# Patient Record
Sex: Female | Born: 1975 | Race: Black or African American | Hispanic: No | Marital: Single | Smoking: Never smoker
Health system: Southern US, Community
[De-identification: ages and names within clinical notes are randomized; demographics above are authoritative.]

## PROBLEM LIST (undated history)

## (undated) DIAGNOSIS — R011 Cardiac murmur, unspecified: Secondary | ICD-10-CM

## (undated) DIAGNOSIS — I10 Essential (primary) hypertension: Secondary | ICD-10-CM

## (undated) HISTORY — PX: COLONOSCOPY: SHX174

## (undated) HISTORY — PX: DENTAL SURGERY: SHX609

---

## 1998-07-30 ENCOUNTER — Emergency Department (HOSPITAL_COMMUNITY): Admission: EM | Admit: 1998-07-30 | Discharge: 1998-07-31 | Payer: Self-pay | Admitting: Internal Medicine

## 2001-12-03 ENCOUNTER — Emergency Department (HOSPITAL_COMMUNITY): Admission: EM | Admit: 2001-12-03 | Discharge: 2001-12-03 | Payer: Self-pay | Admitting: Emergency Medicine

## 2001-12-05 ENCOUNTER — Emergency Department (HOSPITAL_COMMUNITY): Admission: EM | Admit: 2001-12-05 | Discharge: 2001-12-05 | Payer: Self-pay | Admitting: Emergency Medicine

## 2001-12-09 ENCOUNTER — Emergency Department (HOSPITAL_COMMUNITY): Admission: EM | Admit: 2001-12-09 | Discharge: 2001-12-09 | Payer: Self-pay

## 2003-02-05 ENCOUNTER — Emergency Department (HOSPITAL_COMMUNITY): Admission: EM | Admit: 2003-02-05 | Discharge: 2003-02-05 | Payer: Self-pay | Admitting: Emergency Medicine

## 2003-02-05 ENCOUNTER — Encounter: Payer: Self-pay | Admitting: Emergency Medicine

## 2003-02-07 ENCOUNTER — Emergency Department (HOSPITAL_COMMUNITY): Admission: EM | Admit: 2003-02-07 | Discharge: 2003-02-07 | Payer: Self-pay | Admitting: Emergency Medicine

## 2003-02-19 ENCOUNTER — Ambulatory Visit (HOSPITAL_COMMUNITY): Admission: RE | Admit: 2003-02-19 | Discharge: 2003-02-19 | Payer: Self-pay | Admitting: Internal Medicine

## 2003-02-26 ENCOUNTER — Ambulatory Visit (HOSPITAL_COMMUNITY): Admission: RE | Admit: 2003-02-26 | Discharge: 2003-02-26 | Payer: Self-pay | Admitting: Internal Medicine

## 2004-09-25 ENCOUNTER — Emergency Department (HOSPITAL_COMMUNITY): Admission: EM | Admit: 2004-09-25 | Discharge: 2004-09-25 | Payer: Self-pay | Admitting: Emergency Medicine

## 2005-07-27 ENCOUNTER — Emergency Department (HOSPITAL_COMMUNITY): Admission: EM | Admit: 2005-07-27 | Discharge: 2005-07-27 | Payer: Self-pay | Admitting: Family Medicine

## 2006-01-28 ENCOUNTER — Emergency Department (HOSPITAL_COMMUNITY): Admission: EM | Admit: 2006-01-28 | Discharge: 2006-01-28 | Payer: Self-pay | Admitting: Family Medicine

## 2007-01-22 ENCOUNTER — Emergency Department (HOSPITAL_COMMUNITY): Admission: EM | Admit: 2007-01-22 | Discharge: 2007-01-22 | Payer: Self-pay | Admitting: Emergency Medicine

## 2007-05-27 ENCOUNTER — Ambulatory Visit: Payer: Self-pay | Admitting: Vascular Surgery

## 2007-05-27 ENCOUNTER — Emergency Department (HOSPITAL_COMMUNITY): Admission: EM | Admit: 2007-05-27 | Discharge: 2007-05-27 | Payer: Self-pay | Admitting: Emergency Medicine

## 2007-05-28 ENCOUNTER — Emergency Department (HOSPITAL_COMMUNITY): Admission: EM | Admit: 2007-05-28 | Discharge: 2007-05-28 | Payer: Self-pay | Admitting: Emergency Medicine

## 2007-09-01 ENCOUNTER — Emergency Department (HOSPITAL_COMMUNITY): Admission: EM | Admit: 2007-09-01 | Discharge: 2007-09-01 | Payer: Self-pay | Admitting: Emergency Medicine

## 2007-09-04 ENCOUNTER — Emergency Department (HOSPITAL_COMMUNITY): Admission: EM | Admit: 2007-09-04 | Discharge: 2007-09-04 | Payer: Self-pay | Admitting: Emergency Medicine

## 2007-12-07 ENCOUNTER — Emergency Department (HOSPITAL_COMMUNITY): Admission: EM | Admit: 2007-12-07 | Discharge: 2007-12-07 | Payer: Self-pay | Admitting: Family Medicine

## 2007-12-12 ENCOUNTER — Emergency Department (HOSPITAL_COMMUNITY): Admission: EM | Admit: 2007-12-12 | Discharge: 2007-12-12 | Payer: Self-pay | Admitting: Family Medicine

## 2008-10-12 ENCOUNTER — Emergency Department (HOSPITAL_COMMUNITY): Admission: EM | Admit: 2008-10-12 | Discharge: 2008-10-12 | Payer: Self-pay | Admitting: Family Medicine

## 2008-10-31 ENCOUNTER — Emergency Department (HOSPITAL_COMMUNITY): Admission: EM | Admit: 2008-10-31 | Discharge: 2008-10-31 | Payer: Self-pay | Admitting: Family Medicine

## 2009-03-04 ENCOUNTER — Emergency Department (HOSPITAL_COMMUNITY): Admission: EM | Admit: 2009-03-04 | Discharge: 2009-03-04 | Payer: Self-pay | Admitting: Family Medicine

## 2009-03-16 ENCOUNTER — Emergency Department (HOSPITAL_COMMUNITY): Admission: EM | Admit: 2009-03-16 | Discharge: 2009-03-16 | Payer: Self-pay | Admitting: Family Medicine

## 2009-03-19 ENCOUNTER — Emergency Department (HOSPITAL_COMMUNITY): Admission: EM | Admit: 2009-03-19 | Discharge: 2009-03-19 | Payer: Self-pay | Admitting: Emergency Medicine

## 2009-04-30 ENCOUNTER — Emergency Department (HOSPITAL_COMMUNITY): Admission: EM | Admit: 2009-04-30 | Discharge: 2009-04-30 | Payer: Self-pay | Admitting: Family Medicine

## 2009-05-23 ENCOUNTER — Emergency Department (HOSPITAL_COMMUNITY): Admission: EM | Admit: 2009-05-23 | Discharge: 2009-05-23 | Payer: Self-pay | Admitting: Emergency Medicine

## 2009-08-05 ENCOUNTER — Emergency Department (HOSPITAL_COMMUNITY): Admission: EM | Admit: 2009-08-05 | Discharge: 2009-08-05 | Payer: Self-pay | Admitting: Emergency Medicine

## 2009-10-13 ENCOUNTER — Emergency Department (HOSPITAL_COMMUNITY): Admission: EM | Admit: 2009-10-13 | Discharge: 2009-10-13 | Payer: Self-pay | Admitting: Emergency Medicine

## 2009-12-30 ENCOUNTER — Emergency Department (HOSPITAL_COMMUNITY): Admission: EM | Admit: 2009-12-30 | Discharge: 2009-12-30 | Payer: Self-pay | Admitting: Emergency Medicine

## 2010-06-13 ENCOUNTER — Other Ambulatory Visit: Admission: RE | Admit: 2010-06-13 | Discharge: 2010-06-13 | Payer: Self-pay | Admitting: Family Medicine

## 2011-01-27 LAB — URINALYSIS, ROUTINE W REFLEX MICROSCOPIC
Bilirubin Urine: NEGATIVE
Hgb urine dipstick: NEGATIVE
Ketones, ur: 15 mg/dL — AB
Nitrite: NEGATIVE
pH: 5.5 (ref 5.0–8.0)

## 2011-01-27 LAB — POCT PREGNANCY, URINE: Preg Test, Ur: NEGATIVE

## 2011-01-27 LAB — URINE MICROSCOPIC-ADD ON

## 2011-01-30 LAB — CBC
Hemoglobin: 13.3 g/dL (ref 12.0–15.0)
MCHC: 33.3 g/dL (ref 30.0–36.0)
MCV: 82.4 fL (ref 78.0–100.0)
RBC: 4.83 MIL/uL (ref 3.87–5.11)
RDW: 14.7 % (ref 11.5–15.5)

## 2011-01-30 LAB — DIFFERENTIAL
Basophils Relative: 0 % (ref 0–1)
Eosinophils Absolute: 0 10*3/uL (ref 0.0–0.7)
Eosinophils Relative: 0 % (ref 0–5)
Monocytes Absolute: 0.4 10*3/uL (ref 0.1–1.0)
Monocytes Relative: 7 % (ref 3–12)

## 2011-01-30 LAB — URINE CULTURE

## 2011-01-30 LAB — POCT URINALYSIS DIP (DEVICE)
Bilirubin Urine: NEGATIVE
Glucose, UA: NEGATIVE mg/dL
Ketones, ur: NEGATIVE mg/dL
Protein, ur: NEGATIVE mg/dL

## 2011-01-30 LAB — RAPID URINE DRUG SCREEN, HOSP PERFORMED
Amphetamines: NOT DETECTED
Tetrahydrocannabinol: NOT DETECTED

## 2011-01-30 LAB — TSH: TSH: 2.282 u[IU]/mL (ref 0.350–4.500)

## 2011-01-30 LAB — BASIC METABOLIC PANEL
CO2: 24 mEq/L (ref 19–32)
Chloride: 104 mEq/L (ref 96–112)
GFR calc Af Amer: >60 mL/min (ref >60)
Glucose, Bld: 91 mg/dL (ref 70–99)
Sodium: 139 mEq/L (ref 135–145)

## 2011-01-30 LAB — POCT CARDIAC MARKERS: Troponin i, poc: <0.05 ng/mL (ref 0.00–0.09)

## 2011-01-30 LAB — T4, FREE: Free T4: 1 ng/dL (ref 0.80–1.80)

## 2011-01-30 LAB — POCT PREGNANCY, URINE: Preg Test, Ur: NEGATIVE

## 2011-02-05 LAB — POCT URINALYSIS DIP (DEVICE)
Glucose, UA: NEGATIVE mg/dL
Hgb urine dipstick: NEGATIVE
Specific Gravity, Urine: 1.02 (ref 1.005–1.030)
Urobilinogen, UA: 0.2 mg/dL (ref 0.0–1.0)

## 2011-07-13 LAB — INFLUENZA A AND B ANTIGEN (CONVERTED LAB)
Inflenza A Ag: NEGATIVE
Influenza B Ag: NEGATIVE

## 2011-07-31 LAB — POCT URINALYSIS DIP (DEVICE)
Operator id: 116391
Protein, ur: NEGATIVE
Urobilinogen, UA: 0.2

## 2011-07-31 LAB — POCT PREGNANCY, URINE: Operator id: 116391

## 2011-08-06 LAB — DIFFERENTIAL
Basophils Absolute: 0
Eosinophils Relative: 0
Lymphocytes Relative: 5 — ABNORMAL LOW
Monocytes Absolute: 0.3
Monocytes Relative: 3

## 2011-08-06 LAB — CBC
HCT: 36.6
Hemoglobin: 12.2
MCHC: 33.5
RDW: 15.3 — ABNORMAL HIGH

## 2011-08-06 LAB — BASIC METABOLIC PANEL
CO2: 25
GFR calc non Af Amer: >60
Glucose, Bld: 102 — ABNORMAL HIGH
Potassium: 3.8
Sodium: 140

## 2011-08-06 LAB — PREGNANCY, URINE: Preg Test, Ur: NEGATIVE

## 2011-08-06 LAB — URINALYSIS, ROUTINE W REFLEX MICROSCOPIC
Bilirubin Urine: NEGATIVE
Hgb urine dipstick: NEGATIVE
Ketones, ur: 15 — AB
Ketones, ur: 40 — AB
Nitrite: NEGATIVE
Nitrite: POSITIVE — AB
Urobilinogen, UA: 0.2
pH: 7.5

## 2011-08-29 ENCOUNTER — Emergency Department (INDEPENDENT_AMBULATORY_CARE_PROVIDER_SITE_OTHER)
Admission: EM | Admit: 2011-08-29 | Discharge: 2011-08-29 | Disposition: A | Discharge status: Home / Self Care | Payer: BC Managed Care – PPO | Source: Home / Self Care | Attending: Family Medicine | Admitting: Family Medicine

## 2011-08-29 ENCOUNTER — Encounter: Payer: Self-pay | Admitting: *Deleted

## 2011-08-29 DIAGNOSIS — Z0100 Encounter for examination of eyes and vision without abnormal findings: Secondary | ICD-10-CM

## 2011-08-29 HISTORY — DX: Cardiac murmur, unspecified: R01.1
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
## 2011-09-11 ENCOUNTER — Encounter (HOSPITAL_COMMUNITY): Payer: Self-pay | Admitting: Emergency Medicine

## 2011-09-11 ENCOUNTER — Emergency Department (HOSPITAL_COMMUNITY)
Admission: EM | Admit: 2011-09-11 | Discharge: 2011-09-11 | Disposition: A | Discharge status: Home / Self Care | Payer: BC Managed Care – PPO | Source: Home / Self Care | Attending: Emergency Medicine | Admitting: Emergency Medicine

## 2011-09-11 DIAGNOSIS — J329 Chronic sinusitis, unspecified: Secondary | ICD-10-CM

## 2011-09-11 MED ORDER — AMOXICILLIN 875 MG PO TABS: 875.0000 mg | ORAL_TABLET | Freq: Two times a day (BID) | ORAL | Status: AC

## 2011-09-11 NOTE — ED Notes (Signed)
C/o sinuis drainage, down throat.  Stuffy nose at night, c/o chest congestion. Minimal phlegm, slightly green.  Denies fever

## 2011-09-11 NOTE — ED Provider Notes (Signed)
History     CSN: [id number] Arrival date & time: 09/11/2011 11:32 AM   First MD Initiated Contact with Patient 09/11/11 1310      Chief Complaint  Patient presents with  . URI    (Consider location/radiation/quality/duration/timing/severity/associated sxs/prior treatment) Patient is a 35 y.o. female presenting with URI. The history is provided by the patient.  URI The primary symptoms include cough. Primary symptoms do not include fever, fatigue, headaches, ear pain, sore throat, wheezing, nausea or vomiting. Primary symptoms comment: green nasal congestion and post nasal drainage The current episode started 3 to 5 days ago. This is a new problem. The problem has not changed since onset. The cough began 3 to 5 days ago. The cough is new. The cough is non-productive (occasionally productive from the post nasal drainage).  Symptoms associated with the illness include congestion. The illness is not associated with chills, sinus pressure or rhinorrhea. The following treatments were addressed: Acetaminophen was effective. A decongestant was ineffective.    Past Medical History  Diagnosis Date  . Murmur, cardiac     Past Surgical History  Procedure Date  . Dental surgery     Family History  Problem Relation Age of Onset  . Hypertension Mother   . Hypertension Father     History  Substance Use Topics  . Smoking status: Never Smoker   . Smokeless tobacco: Not on file  . Alcohol Use: No    OB History    Grav Para Term Preterm Abortions TAB SAB Ect Mult Living                  Review of Systems  Constitutional: Negative for fever, chills and fatigue.  HENT: Positive for congestion and postnasal drip. Negative for ear pain, sore throat, rhinorrhea, sneezing and sinus pressure.   Respiratory: Positive for cough. Negative for shortness of breath and wheezing.   Cardiovascular: Negative for chest pain and palpitations.  Gastrointestinal: Negative for nausea and vomiting.    Neurological: Negative for headaches.    Allergies  Review of patient's allergies indicates no known allergies.  Home Medications   Current Outpatient Rx  Name Route Sig Dispense Refill  . ACETAMINOPHEN 500 MG PO TABS Oral Take 500 mg by mouth every 6 (six) hours as needed.      . AMOXICILLIN 875 MG PO TABS Oral Take 1 tablet (875 mg total) by mouth 2 (two) times daily. 20 tablet 0    BP 121/71  Pulse 71  Temp(Src) 98.4 F (36.9 C) (Oral)  Resp 16  SpO2 100%  LMP 09/11/2011  Physical Exam  Nursing note and vitals reviewed. Constitutional: She appears well-developed and well-nourished. No distress.  HENT:  Head: Normocephalic and atraumatic.  Right Ear: Tympanic membrane, external ear and ear canal normal.  Left Ear: Tympanic membrane, external ear and ear canal normal.  Nose: Mucosal edema (Rt > Lt) present. Right sinus exhibits no maxillary sinus tenderness and no frontal sinus tenderness. Left sinus exhibits no maxillary sinus tenderness and no frontal sinus tenderness.  Mouth/Throat: Uvula is midline, oropharynx is clear and moist and mucous membranes are normal. No oropharyngeal exudate, posterior oropharyngeal edema or posterior oropharyngeal erythema.  Neck: Neck supple.  Cardiovascular: Normal rate, regular rhythm and normal heart sounds.   Pulmonary/Chest: Effort normal and breath sounds normal. No respiratory distress.  Lymphadenopathy:    She has no cervical adenopathy.  Neurological: She is alert.  Skin: Skin is warm and dry.  Psychiatric: She has a normal  mood and affect.    ED Course  Procedures (including critical care time)  Labs Reviewed - No data to display No results found.   1. Sinusitis       MDM          Melody Comas, PA 09/11/11 1322

## 2011-09-11 NOTE — ED Provider Notes (Signed)
Medical screening examination/treatment/procedure(s) were performed by non-physician practitioner and as supervising physician I was immediately available for consultation/collaboration.  Raynald Blend, MD 09/11/11 1438

## 2012-12-01 ENCOUNTER — Other Ambulatory Visit (HOSPITAL_COMMUNITY)
Admission: RE | Admit: 2012-12-01 | Discharge: 2012-12-01 | Disposition: A | Discharge status: Home / Self Care | Payer: Commercial Indemnity | Source: Ambulatory Visit | Attending: Family Medicine | Admitting: Family Medicine

## 2012-12-01 ENCOUNTER — Other Ambulatory Visit: Payer: Self-pay | Admitting: Family Medicine

## 2012-12-01 DIAGNOSIS — Z113 Encounter for screening for infections with a predominantly sexual mode of transmission: Secondary | ICD-10-CM | POA: Insufficient documentation

## 2012-12-01 DIAGNOSIS — Z124 Encounter for screening for malignant neoplasm of cervix: Secondary | ICD-10-CM | POA: Insufficient documentation

## 2012-12-01 DIAGNOSIS — Z1151 Encounter for screening for human papillomavirus (HPV): Secondary | ICD-10-CM | POA: Insufficient documentation

## 2013-05-28 ENCOUNTER — Encounter (HOSPITAL_COMMUNITY): Payer: Self-pay | Admitting: Emergency Medicine

## 2013-05-28 ENCOUNTER — Emergency Department (HOSPITAL_COMMUNITY)
Admission: EM | Admit: 2013-05-28 | Discharge: 2013-05-28 | Disposition: A | Discharge status: Home / Self Care | Payer: Commercial Indemnity | Attending: Emergency Medicine | Admitting: Emergency Medicine

## 2013-05-28 DIAGNOSIS — R609 Edema, unspecified: Secondary | ICD-10-CM | POA: Insufficient documentation

## 2013-05-28 DIAGNOSIS — R011 Cardiac murmur, unspecified: Secondary | ICD-10-CM | POA: Insufficient documentation

## 2013-05-28 MED ORDER — HYDROCHLOROTHIAZIDE 25 MG PO TABS: 25.0000 mg | ORAL_TABLET | Freq: Every day | ORAL | Status: DC

## 2013-05-28 NOTE — ED Notes (Signed)
Pt comfortable with d/c and f/u instructions. Prescriptions x1 

## 2013-05-28 NOTE — ED Provider Notes (Signed)
CSN: [id number]     Arrival date & time 05/28/13  0413 History     First MD Initiated Contact with Patient 05/28/13 [phone]     Chief Complaint  Patient presents with  . Leg Swelling   (Consider location/radiation/quality/duration/timing/severity/associated sxs/prior Treatment) HPI This 37 year old female has chronic bilateral lower extremity edema left leg worse than the right chronically, she has had prior negative ultrasound to rule out DVT left leg in the past, she has compression stockings but has not been wearing them, she has not been elevating her legs at night but she is supposed to do either, tonight she realized she is more swelling to the top of her left foot than usual but has baseline swelling to her lower legs, she is no fever chest pain shortness breath lightheadedness type pain calf pain redness to her skin weakness or numbness or other concerns. She states her doctor was going to start some water pills as her edema tended to get worse and she would like to do that today. Past Medical History  Diagnosis Date  . Murmur, cardiac    Past Surgical History  Procedure Laterality Date  . Dental surgery     Family History  Problem Relation Age of Onset  . Hypertension Mother   . Hypertension Father    History  Substance Use Topics  . Smoking status: Never Smoker   . Smokeless tobacco: Not on file  . Alcohol Use: No   OB History   Grav Para Term Preterm Abortions TAB SAB Ect Mult Living                 Review of Systems 10 Systems reviewed and are negative for acute change except as noted in the HPI. Allergies  Review of patient's allergies indicates no known allergies.  Home Medications   Current Outpatient Rx  Name  Route  Sig  Dispense  Refill  . hydrochlorothiazide (HYDRODIURIL) 25 MG tablet   Oral   Take 1 tablet (25 mg total) by mouth daily.   3 tablet   0    BP 138/84  Pulse 107  Temp(Src) 99 F (37.2 C) (Oral)  Resp 18  SpO2 100%  LMP  05/25/2013 Physical Exam  Nursing note and vitals reviewed. Constitutional:  Awake, alert, nontoxic appearance.  HENT:  Head: Atraumatic.  Eyes: Right eye exhibits no discharge. Left eye exhibits no discharge.  Neck: Neck supple.  Cardiovascular: Normal rate and regular rhythm.   No murmur heard. Pulmonary/Chest: Effort normal and breath sounds normal. No respiratory distress. She has no wheezes. She has no rales. She exhibits no tenderness.  Abdominal: Soft. There is no tenderness. There is no rebound.  Musculoskeletal: She exhibits edema. She exhibits no tenderness.  Baseline ROM, no obvious new focal weakness. Both thighs and lower legs nontender with mild edema to both lower legs mild edema to the dorsum left foot normal light touch to the feet refill less than 2 seconds to the feet no erythema to the skin to suggest cellulitis  Neurological:  Mental status and motor strength appears baseline for patient and situation.  Skin: No rash noted.  Psychiatric: She has a normal mood and affect.    ED Course  Patient / Family / Caregiver understand and agree with ED impression and plan with expectations set for ED visit and follow-up. Procedures (including critical care time)  Labs Reviewed - No data to display No results found. 1. Peripheral edema     MDM  I  doubt any other EMC precluding discharge at this time including, but not necessarily limited to the following:DVT, SBI, ACS.  Hurman Horn, MD 05/28/13 2105

## 2013-05-28 NOTE — ED Notes (Signed)
PT. REPORTS PROGRESSING LEFT LOWER LEG / LEFT FOOT EDEMA ONSET LAST NIGHT , RESPIRATIONS UNLABORED /AMBULATORY. DENIES INJURY .

## 2013-06-07 ENCOUNTER — Encounter (HOSPITAL_COMMUNITY): Payer: Self-pay | Admitting: *Deleted

## 2013-06-07 ENCOUNTER — Emergency Department (HOSPITAL_COMMUNITY)
Admission: EM | Admit: 2013-06-07 | Discharge: 2013-06-07 | Disposition: A | Discharge status: Home / Self Care | Payer: Commercial Indemnity | Attending: Emergency Medicine | Admitting: Emergency Medicine

## 2013-06-07 DIAGNOSIS — R011 Cardiac murmur, unspecified: Secondary | ICD-10-CM | POA: Insufficient documentation

## 2013-06-07 DIAGNOSIS — Y9389 Activity, other specified: Secondary | ICD-10-CM | POA: Insufficient documentation

## 2013-06-07 DIAGNOSIS — IMO0002 Reserved for concepts with insufficient information to code with codable children: Secondary | ICD-10-CM | POA: Insufficient documentation

## 2013-06-07 DIAGNOSIS — Y92009 Unspecified place in unspecified non-institutional (private) residence as the place of occurrence of the external cause: Secondary | ICD-10-CM | POA: Insufficient documentation

## 2013-06-07 DIAGNOSIS — Z79899 Other long term (current) drug therapy: Secondary | ICD-10-CM | POA: Insufficient documentation

## 2013-06-07 DIAGNOSIS — X503XXA Overexertion from repetitive movements, initial encounter: Secondary | ICD-10-CM | POA: Insufficient documentation

## 2013-06-07 DIAGNOSIS — T148XXA Other injury of unspecified body region, initial encounter: Secondary | ICD-10-CM

## 2013-06-07 MED ORDER — IBUPROFEN 400 MG PO TABS: 400.0000 mg | ORAL_TABLET | Freq: Four times a day (QID) | ORAL | Status: DC | PRN

## 2013-06-07 NOTE — ED Provider Notes (Signed)
CSN: [id number]     Arrival date & time 06/07/13  1835 History     First MD Initiated Contact with Patient 06/07/13 2009     Chief Complaint  Patient presents with  . Leg Pain   HPI  History provided by the patient. Patient is a 37 year old female with history of obesity who presents with complaints of pain and soreness around her right thigh and knee. Patient first began noticing slight ache and irritation yesterday. She does report that she was doing some bending and lifting to rearrange furniture in a mattress at home but she denied any specific injury or trauma. She states that she was slightly concerned for the discomfort because it was unusual for her. It does not seem significantly worse with any movements or activity. She notices it most with outpatient. Denies any swelling or lump under the skin. Denies any erythema of the skin. Patient was also concerned because she recently had swelling to both of her feet and lower legs. She was seen in emergency room for this earlier this month. Since that time she states swelling has improved back to baseline. She denies any other aggravating or alleviating factors. No other associated symptoms. No fever, chills or sweats. No recent long travel. No immobility. No previous history of DVT or PE. She does not use any birth control or estrogen. She is a nonsmoker.    Past Medical History  Diagnosis Date  . Murmur, cardiac    Past Surgical History  Procedure Laterality Date  . Dental surgery     Family History  Problem Relation Age of Onset  . Hypertension Mother   . Hypertension Father    History  Substance Use Topics  . Smoking status: Never Smoker   . Smokeless tobacco: Not on file  . Alcohol Use: No   OB History   Grav Para Term Preterm Abortions TAB SAB Ect Mult Living                 Review of Systems  Constitutional: Negative for fever, chills and diaphoresis.  Respiratory: Negative for shortness of breath.   Cardiovascular:  Negative for chest pain, palpitations and leg swelling.  Neurological: Negative for weakness and numbness.  All other systems reviewed and are negative.    Allergies  Review of patient's allergies indicates no known allergies.  Home Medications   Current Outpatient Rx  Name  Route  Sig  Dispense  Refill  . acetaminophen (TYLENOL) 325 MG tablet   Oral   Take 650 mg by mouth every 6 (six) hours as needed for pain.         . hydrochlorothiazide (HYDRODIURIL) 25 MG tablet   Oral   Take 1 tablet (25 mg total) by mouth daily.   3 tablet   0    BP 138/84  Pulse 102  Temp(Src) 98.1 F (36.7 C) (Oral)  Resp 18  SpO2 98%  LMP 05/25/2013 Physical Exam  Nursing note and vitals reviewed. Constitutional: She is oriented to person, place, and time. She appears well-developed and well-nourished. No distress.  HENT:  Head: Normocephalic.  Cardiovascular: Normal rate and regular rhythm.   Pulmonary/Chest: Effort normal and breath sounds normal. No respiratory distress. She has no wheezes. She has no rales.  Abdominal: Soft.  Musculoskeletal: Normal range of motion. She exhibits no edema.       Legs: Patient with a very small area of mild tenderness over the medial distal vastus medialis muscle of the thigh.  Normal range of motion of the knee. Normal strength in all extremities. Normal distal pulses and sensations in the foot. There is no significant swelling of edema of bilateral feet and lower legs. Skin is normal without erythema.  Neurological: She is alert and oriented to person, place, and time.  Skin: Skin is warm and dry. No rash noted.  Psychiatric: She has a normal mood and affect. Her behavior is normal.    ED Course   Procedures    1. Muscle strain     MDM  8:30PM patient seen and evaluated. Patient well-appearing no acute distress. She denies any chest pain or shortness of breath. Her generalized swelling of both legs has improved since her previous visit.  Currently she denies any significant swelling. She has a very localized small area of mild tenderness around that anterior medial and proximal thigh just above the knee. No deformities or mass. Skin normal. Doubt any concerning cause the symptoms. Specifically I doubt any concerning DVT. No signs of cellulitis. No pain in the joint with normal range of motion.   Angus Seller, PA-C 06/07/13 2113

## 2013-06-07 NOTE — ED Notes (Signed)
Pt reports being seen here recently for peripheral edema to legs, now also having pain to right thigh, denies injury. Ambulatory at triage.

## 2013-06-07 NOTE — ED Notes (Signed)
Patient states that she is having pain above right knee.  Patient denies any injury to the area but she states she was moving heavy objects the other day.  Patient states that the area above knee is swollen and painful to the touch.

## 2013-06-08 NOTE — ED Provider Notes (Signed)
Medical screening examination/treatment/procedure(s) were performed by non-physician practitioner and as supervising physician I was immediately available for consultation/collaboration.   Audree Camel, MD 06/08/13 [phone]

## 2013-06-09 ENCOUNTER — Other Ambulatory Visit: Payer: Self-pay | Admitting: Family Medicine

## 2013-06-09 ENCOUNTER — Ambulatory Visit
Admission: RE | Admit: 2013-06-09 | Discharge: 2013-06-09 | Disposition: A | Discharge status: Home / Self Care | Payer: Managed Care, Other (non HMO) | Source: Ambulatory Visit | Attending: Family Medicine | Admitting: Family Medicine

## 2013-06-09 DIAGNOSIS — M7989 Other specified soft tissue disorders: Secondary | ICD-10-CM

## 2013-06-09 DIAGNOSIS — M79604 Pain in right leg: Secondary | ICD-10-CM

## 2013-12-09 ENCOUNTER — Other Ambulatory Visit: Payer: Self-pay | Admitting: Family Medicine

## 2013-12-09 DIAGNOSIS — Z1231 Encounter for screening mammogram for malignant neoplasm of breast: Secondary | ICD-10-CM

## 2013-12-24 ENCOUNTER — Ambulatory Visit
Admission: RE | Admit: 2013-12-24 | Discharge: 2013-12-24 | Disposition: A | Discharge status: Home / Self Care | Payer: Managed Care, Other (non HMO) | Source: Ambulatory Visit | Attending: Family Medicine | Admitting: Family Medicine

## 2013-12-24 DIAGNOSIS — Z1231 Encounter for screening mammogram for malignant neoplasm of breast: Secondary | ICD-10-CM

## 2013-12-25 ENCOUNTER — Other Ambulatory Visit: Payer: Self-pay | Admitting: Family Medicine

## 2013-12-25 DIAGNOSIS — R928 Other abnormal and inconclusive findings on diagnostic imaging of breast: Secondary | ICD-10-CM

## 2014-01-06 ENCOUNTER — Other Ambulatory Visit: Payer: Managed Care, Other (non HMO)

## 2014-01-14 ENCOUNTER — Ambulatory Visit
Admission: RE | Admit: 2014-01-14 | Discharge: 2014-01-14 | Disposition: A | Discharge status: Home / Self Care | Payer: Managed Care, Other (non HMO) | Source: Ambulatory Visit | Attending: Family Medicine | Admitting: Family Medicine

## 2014-01-14 DIAGNOSIS — R928 Other abnormal and inconclusive findings on diagnostic imaging of breast: Secondary | ICD-10-CM

## 2014-06-22 ENCOUNTER — Encounter (HOSPITAL_COMMUNITY): Payer: Self-pay | Admitting: Emergency Medicine

## 2014-06-22 ENCOUNTER — Emergency Department (HOSPITAL_COMMUNITY): Payer: Managed Care, Other (non HMO)

## 2014-06-22 ENCOUNTER — Emergency Department (HOSPITAL_COMMUNITY)
Admission: EM | Admit: 2014-06-22 | Discharge: 2014-06-22 | Disposition: A | Discharge status: Home / Self Care | Payer: Managed Care, Other (non HMO) | Attending: Emergency Medicine | Admitting: Emergency Medicine

## 2014-06-22 DIAGNOSIS — Z79899 Other long term (current) drug therapy: Secondary | ICD-10-CM | POA: Diagnosis not present

## 2014-06-22 DIAGNOSIS — IMO0002 Reserved for concepts with insufficient information to code with codable children: Secondary | ICD-10-CM | POA: Diagnosis not present

## 2014-06-22 DIAGNOSIS — S298XXA Other specified injuries of thorax, initial encounter: Secondary | ICD-10-CM | POA: Insufficient documentation

## 2014-06-22 DIAGNOSIS — Y92410 Unspecified street and highway as the place of occurrence of the external cause: Secondary | ICD-10-CM | POA: Diagnosis not present

## 2014-06-22 DIAGNOSIS — M791 Myalgia, unspecified site: Secondary | ICD-10-CM

## 2014-06-22 DIAGNOSIS — Y9389 Activity, other specified: Secondary | ICD-10-CM | POA: Diagnosis not present

## 2014-06-22 DIAGNOSIS — R011 Cardiac murmur, unspecified: Secondary | ICD-10-CM | POA: Diagnosis not present

## 2014-06-22 MED ORDER — NAPROXEN 500 MG PO TABS: 500.0000 mg | ORAL_TABLET | Freq: Two times a day (BID) | ORAL | Status: DC | PRN
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
## 2014-06-22 NOTE — ED Notes (Signed)
Pt currently in xray
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
## 2014-06-23 NOTE — ED Provider Notes (Signed)
Medical screening examination/treatment/procedure(s) were performed by non-physician practitioner and as supervising physician I was immediately available for consultation/collaboration.   EKG Interpretation None        Debby Freiberg, MD 06/23/14 [phone]

## 2014-07-14 ENCOUNTER — Other Ambulatory Visit: Payer: Self-pay | Admitting: Family Medicine

## 2014-07-14 DIAGNOSIS — N6489 Other specified disorders of breast: Secondary | ICD-10-CM

## 2014-07-14 DIAGNOSIS — N6320 Unspecified lump in the left breast, unspecified quadrant: Secondary | ICD-10-CM

## 2014-07-20 ENCOUNTER — Ambulatory Visit
Admission: RE | Admit: 2014-07-20 | Discharge: 2014-07-20 | Disposition: A | Discharge status: Home / Self Care | Payer: Managed Care, Other (non HMO) | Source: Ambulatory Visit | Attending: Family Medicine | Admitting: Family Medicine

## 2014-07-20 DIAGNOSIS — N6320 Unspecified lump in the left breast, unspecified quadrant: Secondary | ICD-10-CM

## 2014-07-20 DIAGNOSIS — N6489 Other specified disorders of breast: Secondary | ICD-10-CM

## 2015-08-02 IMAGING — MG MM DIGITAL SCREENING BILAT W/ CAD
4 series · 4 of 4 positions shown · non-contrast
Comparison: None

CLINICAL DATA: Screening.

EXAM:
DIGITAL SCREENING BILATERAL MAMMOGRAM WITH CAD

[R CC]
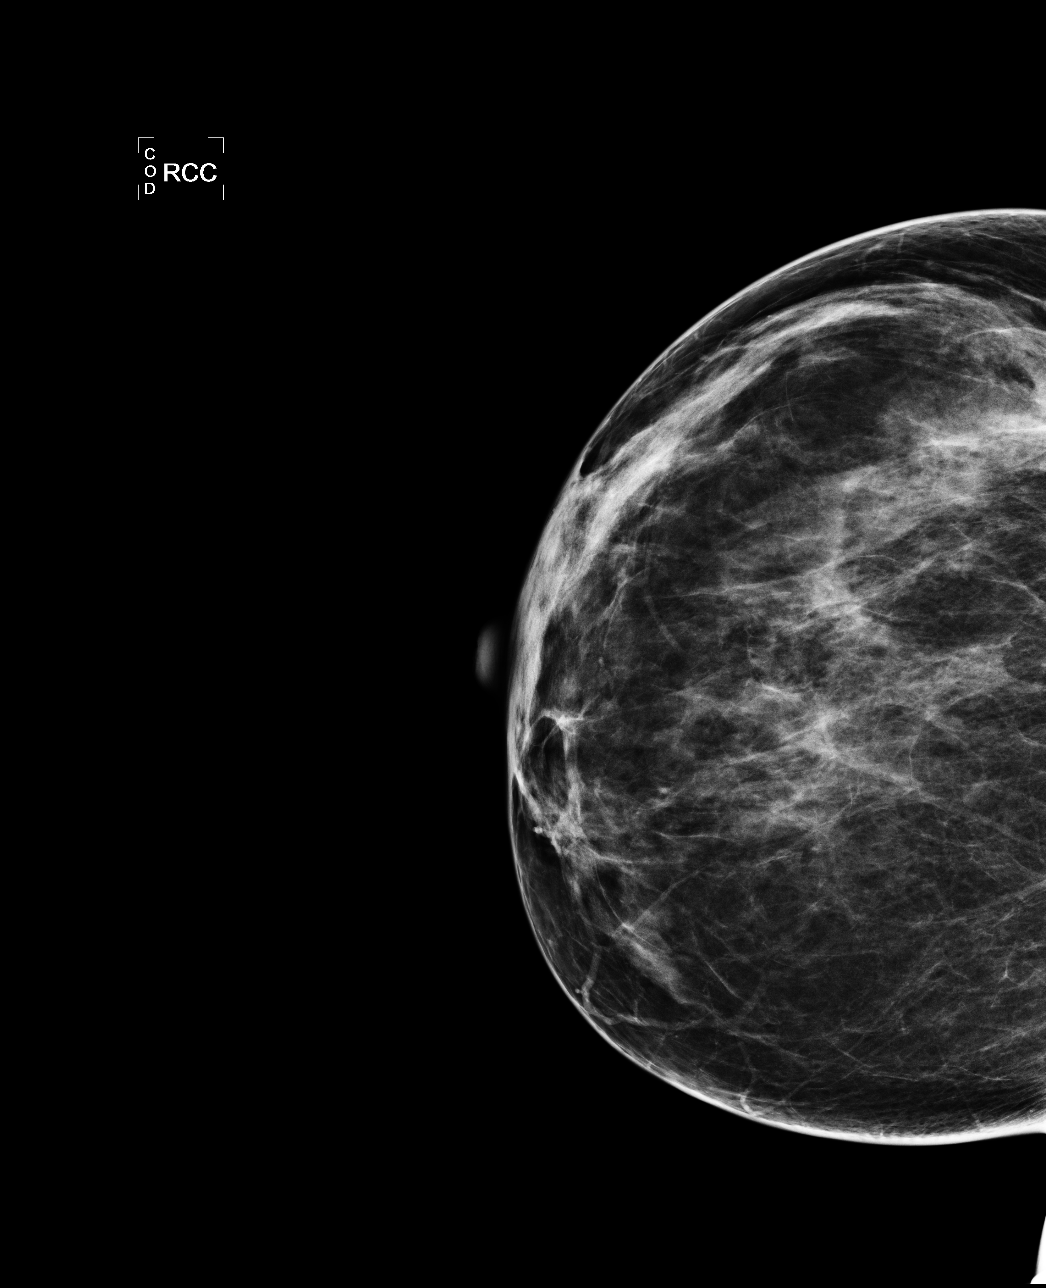

[L CC]
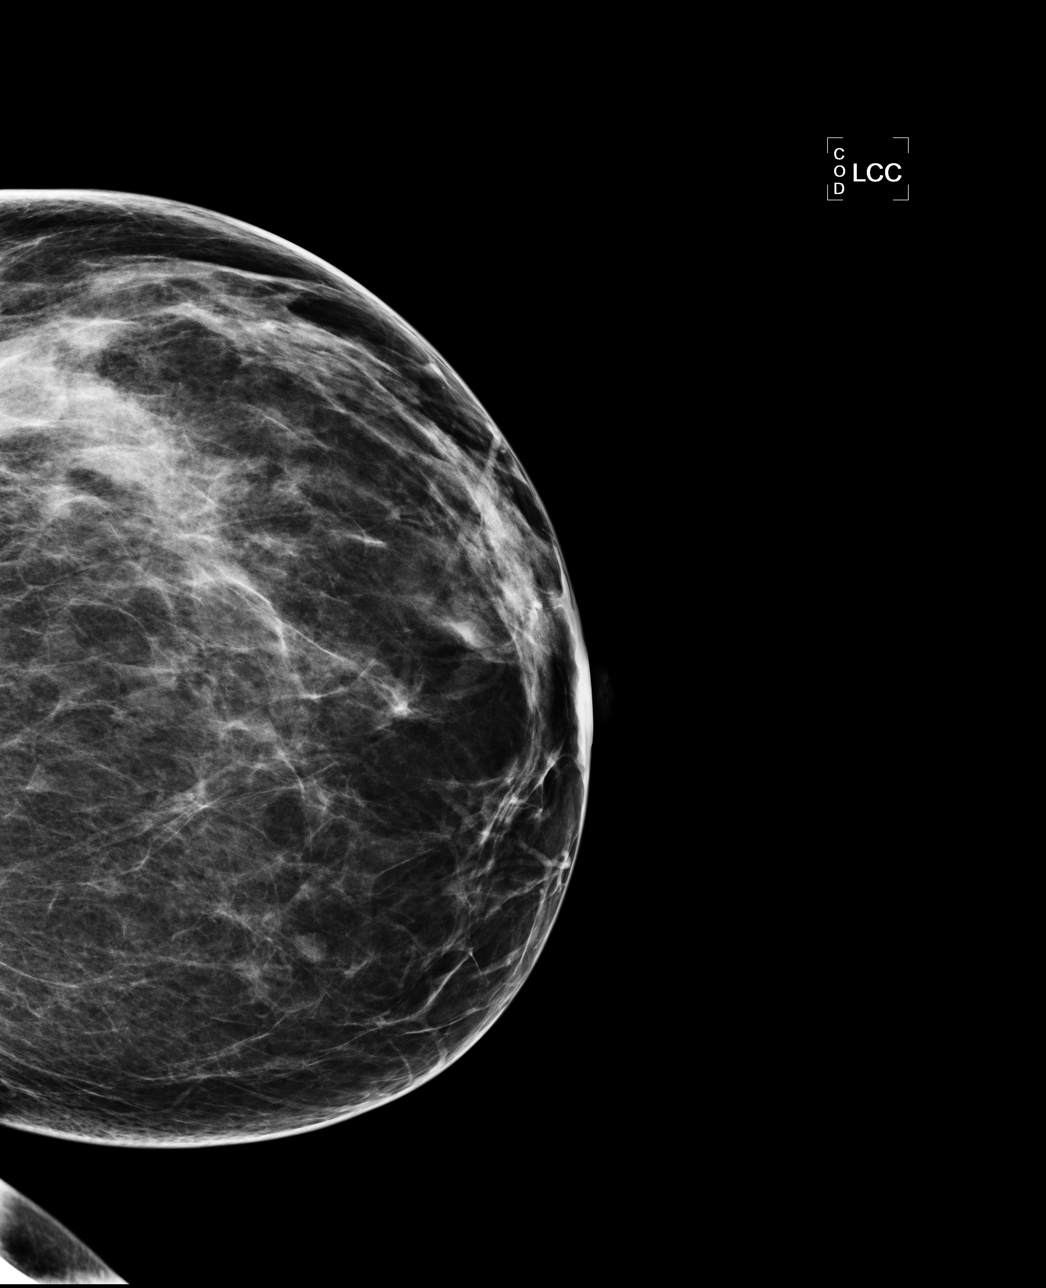

[L MLO]
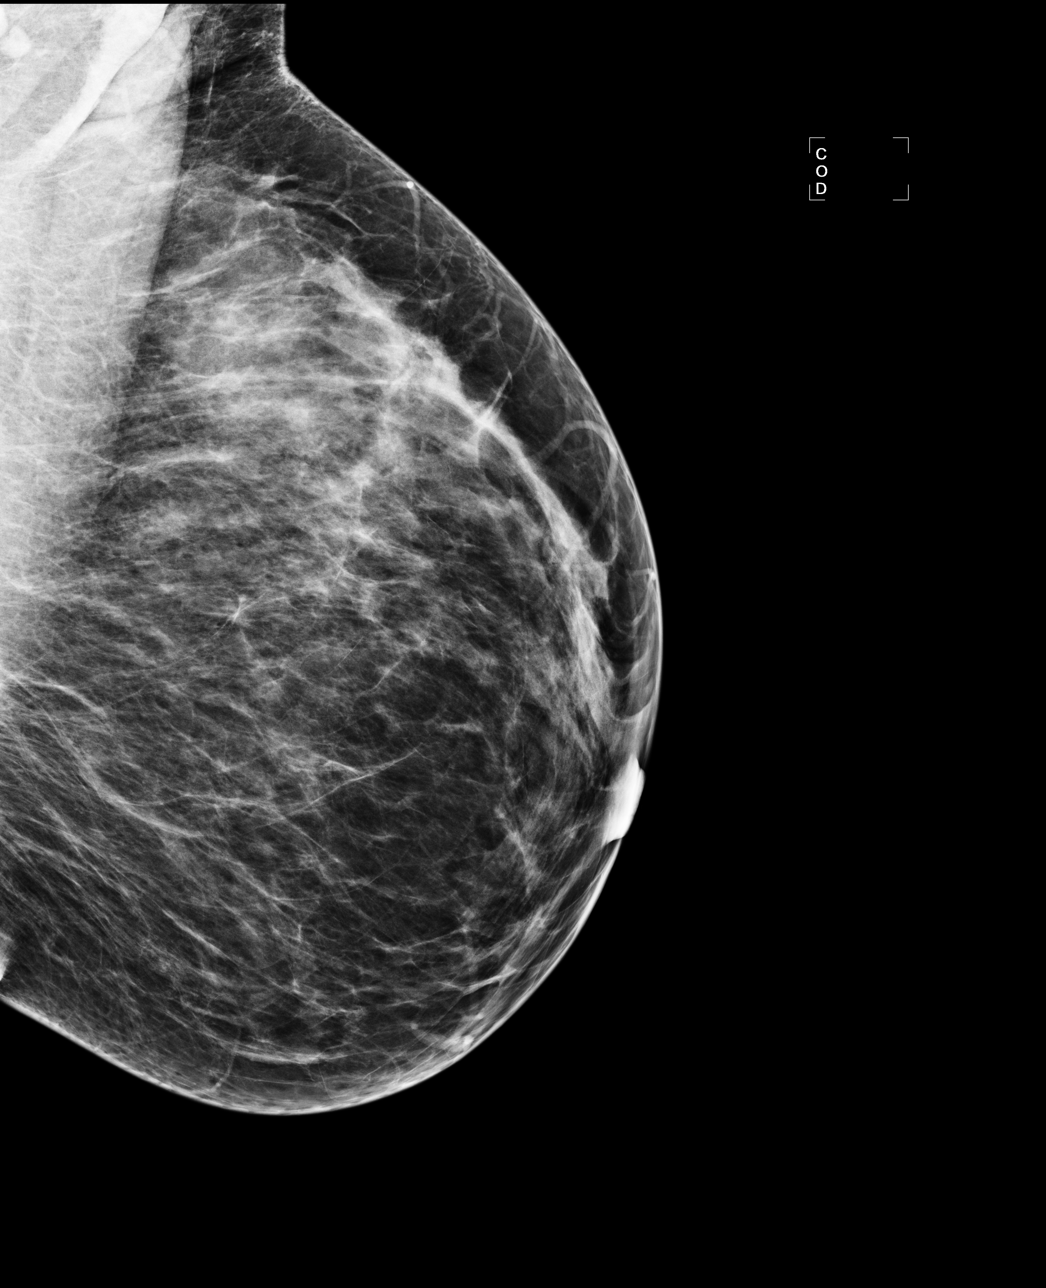

[R MLO]
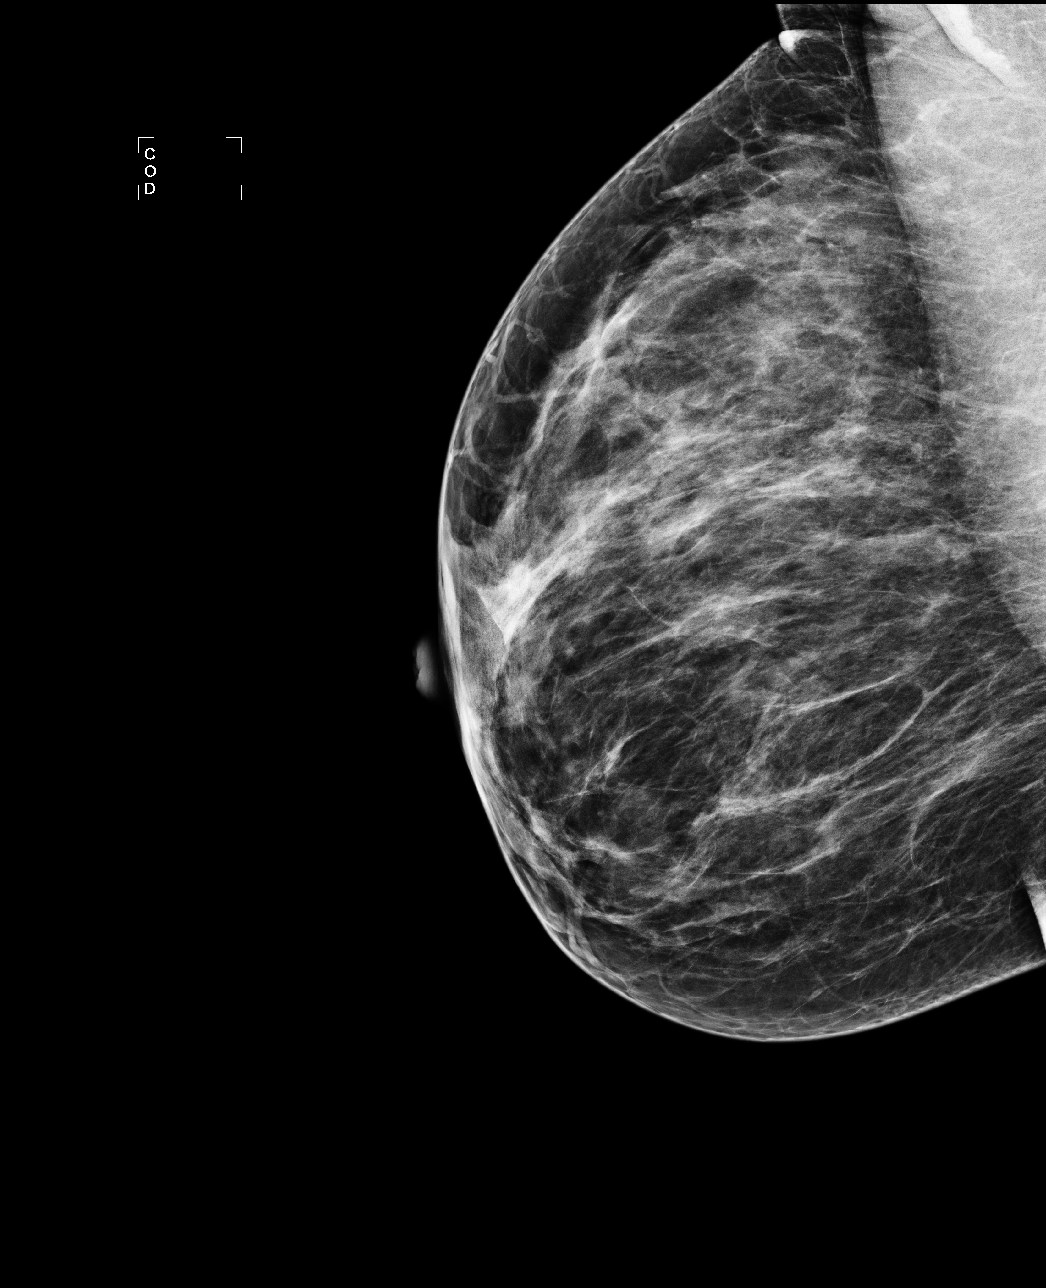

[4 of 4 positions shown; findings below may reference images not displayed]

ACR Breast Density Category c: The breast tissue is heterogeneously
dense, which may obscure small masses.
FINDINGS: In the left breast, a possible asymmetry warrants further evaluation
with spot compression views and possibly ultrasound. In the right
breast, no findings suspicious for malignancy. Images were processed
with CAD.
IMPRESSION: Further evaluation is suggested for possible asymmetry in the left
breast.

RECOMMENDATION:
Diagnostic mammogram and possibly ultrasound of the left breast.
(Code:[id number]-22D)

The patient will be contacted regarding the findings, and additional
imaging will be scheduled.

BI-RADS CATEGORY  0: Incomplete. Need additional imaging evaluation
and/or prior mammograms for comparison.

## 2016-07-19 ENCOUNTER — Other Ambulatory Visit (HOSPITAL_COMMUNITY)
Admission: RE | Admit: 2016-07-19 | Discharge: 2016-07-19 | Disposition: A | Discharge status: Home / Self Care | Payer: 59 | Source: Ambulatory Visit | Attending: Family Medicine | Admitting: Family Medicine

## 2016-07-19 ENCOUNTER — Other Ambulatory Visit: Payer: Self-pay | Admitting: Family Medicine

## 2016-07-19 DIAGNOSIS — Z124 Encounter for screening for malignant neoplasm of cervix: Secondary | ICD-10-CM | POA: Insufficient documentation

## 2016-07-19 DIAGNOSIS — Z1151 Encounter for screening for human papillomavirus (HPV): Secondary | ICD-10-CM | POA: Insufficient documentation

## 2016-07-20 LAB — CYTOLOGY - PAP

## 2016-09-08 ENCOUNTER — Encounter (HOSPITAL_COMMUNITY): Payer: Self-pay | Admitting: Emergency Medicine

## 2016-09-08 ENCOUNTER — Emergency Department (HOSPITAL_COMMUNITY)
Admission: EM | Admit: 2016-09-08 | Discharge: 2016-09-08 | Disposition: A | Discharge status: Home / Self Care | Payer: 59 | Attending: Emergency Medicine | Admitting: Emergency Medicine

## 2016-09-08 DIAGNOSIS — K0889 Other specified disorders of teeth and supporting structures: Secondary | ICD-10-CM | POA: Diagnosis present

## 2016-09-08 DIAGNOSIS — K029 Dental caries, unspecified: Secondary | ICD-10-CM | POA: Diagnosis not present

## 2016-09-08 MED ORDER — HYDROCODONE-ACETAMINOPHEN 5-325 MG PO TABS: 1.0000 | ORAL_TABLET | ORAL | 0 refills | Status: DC | PRN

## 2016-09-08 NOTE — Discharge Instructions (Signed)
Take your medication as prescribed as needed for pain relief. I also recommend continuing to take your prescription of ibuprofen as prescribed for pain relief. I recommend refraining from eating foods or drinking liquids that are extremely hot or cold which may worsen your pain. I recommend calling your dentist office on Monday to schedule a follow-up appointment for reevaluation. Please return to the Emergency Department if symptoms worsen or new onset of fever, headache, facial/neck swelling, drainage, difficulty swallowing resulting in drooling, difficulty breathing, vomiting, unable to keep fluids down.

## 2016-09-08 NOTE — ED Notes (Signed)
Declined W/C at D/C and was escorted to lobby by RN. 

## 2016-09-08 NOTE — ED Triage Notes (Signed)
Filling done on Monday to a tooth in left lower mouth; still in throbbing pain. Tried to call dentist line and no answer. Tried the 800mg  Ibuprofen given by the dentist without any relief.

## 2016-09-08 NOTE — ED Provider Notes (Signed)
[facility] DEPT Provider Note   By signing my name below, I, Bea Graff, attest that this documentation has been prepared under the direction and in the presence of Harlene Ramus, PA-C. Electronically Signed: Bea Graff, ED Scribe. 09/08/16. 2:16 PM.   History   Chief Complaint Chief Complaint  Patient presents with  . Dental Pain   The history is provided by the patient and medical records. No language interpreter was used.    HPI Comments:  Meagan Padilla is a 40 y.o. female who presents to the Emergency Department complaining of left lower dental pain that began about 5 days ago after having the tooth filled at the dentist's office, Dr. Carmelia Roller. Pt said she contacted the office two days ago and was prescribed Ibuprofen 800 mg that she reports taking with minimal relief of the pain. She reports taking an Oxycodone that made her feel "loopy" so she did not want to take anymore. There are no modifying factors noted. She denies facial swelling, neck swelling, drainage from the tooth, fever, chills, nausea, vomiting, difficulty swallowing or breathing.    Past Medical History:  Diagnosis Date  . Murmur, cardiac     There are no active problems to display for this patient.   Past Surgical History:  Procedure Laterality Date  . DENTAL SURGERY      OB History    No data available       Home Medications    Prior to Admission medications   Medication Sig Start Date End Date Taking? Authorizing Provider  acetaminophen (TYLENOL) 325 MG tablet Take 650 mg by mouth every 6 (six) hours as needed for pain.    Historical Provider, MD  hydrochlorothiazide (HYDRODIURIL) 25 MG tablet Take 1 tablet (25 mg total) by mouth daily. 05/28/13   Riki Altes, MD  HYDROcodone-acetaminophen (NORCO/VICODIN) 5-325 MG tablet Take 1 tablet by mouth every 4 (four) hours as needed. 09/08/16   Nona Dell, PA-C  ibuprofen (ADVIL,MOTRIN) 400 MG tablet Take 1 tablet (400 mg  total) by mouth every 6 (six) hours as needed for pain. 06/07/13   Hazel Sams, PA-C  naproxen (NAPROSYN) 500 MG tablet Take 1 tablet (500 mg total) by mouth 2 (two) times daily as needed for mild pain, moderate pain or headache (TAKE WITH MEALS.). 06/22/14   Mercedes Camprubi-Soms, PA-C    Family History Family History  Problem Relation Age of Onset  . Hypertension Mother   . Hypertension Father     Social History Social History  Substance Use Topics  . Smoking status: Never Smoker  . Smokeless tobacco: Never Used  . Alcohol use No     Allergies   Patient has no known allergies.   Review of Systems Review of Systems  Constitutional: Negative for chills and fever.  HENT: Positive for dental problem. Negative for facial swelling and trouble swallowing.   Gastrointestinal: Negative for nausea and vomiting.     Physical Exam Updated Vital Signs BP 162/97 (BP Location: Left Arm)   Pulse 102   Temp 97.9 F (36.6 C) (Oral)   Resp 20   LMP 08/25/2016 (Approximate)   SpO2 99%   Physical Exam  Constitutional: She is oriented to person, place, and time. She appears well-developed and well-nourished.  HENT:  Head: Normocephalic and atraumatic.  Mouth/Throat: Uvula is midline, oropharynx is clear and moist and mucous membranes are normal. No trismus in the jaw. Abnormal dentition. Dental caries present. No uvula swelling. No oropharyngeal exudate.    Poor  dentition throughout with multiple extracted teeth. No swelling, induration, fluctuance or drainage of the gingiva surrounding tooth # 22. Floor of mouth soft. Pt tolerating secretions. No facial or neck swelling.  Eyes: Conjunctivae and EOM are normal. Right eye exhibits no discharge. Left eye exhibits no discharge. No scleral icterus.  Neck: Normal range of motion. Neck supple.  Cardiovascular: Normal rate and intact distal pulses.   HR 84 on exam  Pulmonary/Chest: Effort normal. No stridor.  Lymphadenopathy:    She has  no cervical adenopathy.  Neurological: She is alert and oriented to person, place, and time.  Skin: Skin is warm and dry.  Nursing note and vitals reviewed.    ED Treatments / Results  DIAGNOSTIC STUDIES: Oxygen Saturation is 99% on RA, normal by my interpretation.   COORDINATION OF CARE: 2:13 PM- Will prescribe pain medication and encouraged pt to continue taking Ibuprofen. Instructed her to follow up with her dentist on Monday. Pt verbalizes understanding and agrees to plan.  Medications - No data to display  Labs (all labs ordered are listed, but only abnormal results are displayed) Labs Reviewed - No data to display  EKG  EKG Interpretation None       Radiology No results found.  Procedures Procedures (including critical care time)  Medications Ordered in ED Medications - No data to display   Initial Impression / Assessment and Plan / ED Course  I have reviewed the triage vital signs and the nursing notes.  Pertinent labs & imaging results that were available during my care of the patient were reviewed by me and considered in my medical decision making (see chart for details).  Clinical Course     Patient with toothache.  No gross abscess.  Exam unconcerning for Ludwig's angina or spread of infection.  Will treat with pain medicine.  Urged patient to follow-up with dentist.     I personally performed the services described in this documentation, which was scribed in my presence. The recorded information has been reviewed and is accurate.   Final Clinical Impressions(s) / ED Diagnoses   Final diagnoses:  Pain, dental    New Prescriptions New Prescriptions   HYDROCODONE-ACETAMINOPHEN (NORCO/VICODIN) 5-325 MG TABLET    Take 1 tablet by mouth every 4 (four) hours as needed.     Chesley Noon Hydro, [place] 09/08/16 1429    Pattricia Boss, MD 09/17/16 [phone]

## 2017-04-30 ENCOUNTER — Emergency Department (HOSPITAL_COMMUNITY)
Admission: EM | Admit: 2017-04-30 | Discharge: 2017-04-30 | Disposition: A | Discharge status: Home / Self Care | Payer: 59 | Attending: Emergency Medicine | Admitting: Emergency Medicine

## 2017-04-30 ENCOUNTER — Encounter (HOSPITAL_COMMUNITY): Payer: Self-pay | Admitting: Vascular Surgery

## 2017-04-30 DIAGNOSIS — R51 Headache: Secondary | ICD-10-CM | POA: Insufficient documentation

## 2017-04-30 DIAGNOSIS — Z79899 Other long term (current) drug therapy: Secondary | ICD-10-CM | POA: Insufficient documentation

## 2017-04-30 DIAGNOSIS — I10 Essential (primary) hypertension: Secondary | ICD-10-CM | POA: Insufficient documentation

## 2017-04-30 DIAGNOSIS — Y939 Activity, unspecified: Secondary | ICD-10-CM | POA: Diagnosis not present

## 2017-04-30 DIAGNOSIS — Y999 Unspecified external cause status: Secondary | ICD-10-CM | POA: Diagnosis not present

## 2017-04-30 DIAGNOSIS — Y92481 Parking lot as the place of occurrence of the external cause: Secondary | ICD-10-CM | POA: Insufficient documentation

## 2017-04-30 HISTORY — DX: Essential (primary) hypertension: I10

## 2017-04-30 MED ORDER — CYCLOBENZAPRINE HCL 10 MG PO TABS: 10.0000 mg | ORAL_TABLET | Freq: Three times a day (TID) | ORAL | 0 refills | Status: DC | PRN
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
## 2017-04-30 NOTE — Discharge Instructions (Signed)
Alternate 600 mg of ibuprofen and [id number] mg of Tylenol every 3 hours for the next 3 days. Do not exceed 4000 mg of Tylenol daily. Flexeril for muscle relaxation (preferably only at night) but do not drive or operate machinery with flexeril use. Ice to areas of soreness for the next few days and then may move to heat. Expect to be sore for the next few day and follow up with primary care physician for recheck of ongoing symptoms but return to ER for emergent changing or worsening of symptoms.   Use Moist heat therapy by taking a dish rag and soaking in water then ringing out excess water then microwave for 10-15 seconds until hot but not so hot that it would burn and heat to areas of soreness for 15 minutes, multiple times a day.
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
## 2017-04-30 NOTE — ED Notes (Signed)
Pt is in stable condition upon d/c and ambulates from ED. 

## 2017-10-17 ENCOUNTER — Other Ambulatory Visit: Payer: Self-pay | Admitting: Family Medicine

## 2017-10-17 DIAGNOSIS — Z1231 Encounter for screening mammogram for malignant neoplasm of breast: Secondary | ICD-10-CM

## 2017-11-08 ENCOUNTER — Ambulatory Visit
Admission: RE | Admit: 2017-11-08 | Discharge: 2017-11-08 | Disposition: A | Discharge status: Home / Self Care | Payer: 59 | Source: Ambulatory Visit | Attending: Family Medicine | Admitting: Family Medicine

## 2017-11-08 DIAGNOSIS — Z1231 Encounter for screening mammogram for malignant neoplasm of breast: Secondary | ICD-10-CM

## 2018-01-31 ENCOUNTER — Emergency Department (HOSPITAL_COMMUNITY)
Admission: EM | Admit: 2018-01-31 | Discharge: 2018-01-31 | Disposition: A | Discharge status: Home / Self Care | Payer: 59 | Attending: Emergency Medicine | Admitting: Emergency Medicine

## 2018-01-31 ENCOUNTER — Encounter (HOSPITAL_COMMUNITY): Payer: Self-pay | Admitting: Emergency Medicine

## 2018-01-31 ENCOUNTER — Other Ambulatory Visit: Payer: Self-pay

## 2018-01-31 DIAGNOSIS — R51 Headache: Secondary | ICD-10-CM | POA: Insufficient documentation

## 2018-01-31 DIAGNOSIS — I10 Essential (primary) hypertension: Secondary | ICD-10-CM | POA: Diagnosis present

## 2018-01-31 NOTE — ED Triage Notes (Addendum)
Pt from home with c/o htn. Pt bp at time of assessment is 165/99. Pt states she is under stress from trying to find a job. Pt states she takes her medication some days because she is worried her bp will be low. Pt reports HA but denies changes in vision. Neuro assessment unremarkable

## 2018-01-31 NOTE — ED Provider Notes (Signed)
[facility] DEPT Provider Note   CSN: [id number] Arrival date & time: 01/31/18  0437     History   Chief Complaint Chief Complaint  Patient presents with  . Hypertension    HPI Meagan Padilla is a 42 y.o. female.  HPI  42 year old female presents with hypertension.  She states she knows her blood pressure started going up last night because she developed a flushed and warm feeling and a headache.  She rates the headache is a 1-2.  The headache is improved today.  She did not have any vomiting, dizziness, blurry vision, chest pain, or weakness/numbness.  No trouble urinating.  She checked her blood pressure yesterday and is 143/85 and then she checked it again and was around 160/90.  She is supposed to be taking atenolol-chlorthalidone 50/25 mg.  However she states she takes it very rarely.  She is somewhat concerned that her blood pressure will go lower.  She checks her blood pressure about 2 or 3 times a day.  Usually they average for her is around 130/86.  She estimates she rarely takes her medicine.  Past Medical History:  Diagnosis Date  . Hypertension   . Murmur, cardiac     There are no active problems to display for this patient.   Past Surgical History:  Procedure Laterality Date  . DENTAL SURGERY       OB History   None      Home Medications    Prior to Admission medications   Medication Sig Start Date End Date Taking? Authorizing Provider  acetaminophen (TYLENOL) 325 MG tablet Take 650 mg by mouth every 6 (six) hours as needed for pain.    [provider]  cyclobenzaprine (FLEXERIL) 10 MG tablet Take 1 tablet (10 mg total) by mouth 3 (three) times daily as needed for muscle spasms. 04/30/17   Fawze, Mina A, PA-C  hydrochlorothiazide (HYDRODIURIL) 25 MG tablet Take 1 tablet (25 mg total) by mouth daily. 05/28/13   Riki Altes, MD  HYDROcodone-acetaminophen (NORCO/VICODIN) 5-325 MG tablet Take 1 tablet by mouth every 4  (four) hours as needed. 09/08/16   Nona Dell, PA-C  ibuprofen (ADVIL,MOTRIN) 400 MG tablet Take 1 tablet (400 mg total) by mouth every 6 (six) hours as needed for pain. 06/07/13   Hazel Sams, PA-C  naproxen (NAPROSYN) 500 MG tablet Take 1 tablet (500 mg total) by mouth 2 (two) times daily as needed for mild pain, moderate pain or headache (TAKE WITH MEALS.). 06/22/14   Street, [place], PA-C    Family History Family History  Problem Relation Age of Onset  . Hypertension Mother   . Hypertension Father     Social History Social History   Tobacco Use  . Smoking status: Never Smoker  . Smokeless tobacco: Never Used  Substance Use Topics  . Alcohol use: No  . Drug use: No     Allergies   Patient has no known allergies.   Review of Systems Review of Systems  Eyes: Negative for visual disturbance.  Respiratory: Negative for shortness of breath.   Cardiovascular: Negative for chest pain.  Gastrointestinal: Negative for vomiting.  Neurological: Positive for headaches. Negative for dizziness, weakness and numbness.  All other systems reviewed and are negative.    Physical Exam Updated Vital Signs BP (!) 165/99 (BP Location: Left Arm)   Pulse 72   Temp 98 F (36.7 C) (Oral)   Resp 16   LMP 01/22/2018   SpO2 100%  Physical Exam  Constitutional: She is oriented to person, place, and time. She appears well-developed and well-nourished. No distress.  HENT:  Head: Normocephalic and atraumatic.  Right Ear: External ear normal.  Left Ear: External ear normal.  Nose: Nose normal.  Eyes: Pupils are equal, round, and reactive to light. EOM are normal. Right eye exhibits no discharge. Left eye exhibits no discharge.  Neck: Neck supple.  Cardiovascular: Normal rate, regular rhythm and normal heart sounds.  Pulmonary/Chest: Effort normal and breath sounds normal.  Abdominal: She exhibits no distension.  Neurological: She is alert and oriented to person, place, and  time.  CN 3-12 grossly intact. 5/5 strength in all 4 extremities. Grossly normal sensation. Normal finger to nose.   Skin: Skin is warm and dry. She is not diaphoretic.  Nursing note and vitals reviewed.    ED Treatments / Results  Labs (all labs ordered are listed, but only abnormal results are displayed) Labs Reviewed - No data to display  EKG None  Radiology No results found.  Procedures Procedures (including critical care time)  Medications Ordered in ED Medications - No data to display   Initial Impression / Assessment and Plan / ED Course  I have reviewed the triage vital signs and the nursing notes.  Pertinent labs & imaging results that were available during my care of the patient were reviewed by me and considered in my medical decision making (see chart for details).     Patient has a mild headache but otherwise is well-appearing and in no distress.  She has no signs or symptoms of hypertensive emergency.  She has a long-standing history of high blood pressure.  I discussed the importance of taking her medicine daily as it does not work in prn doses as well.  Discussed if she is particularly concerned about low blood pressure which she has never noticed at home, she can try and cut her medicine in half.  I have encouraged her to keep a log of her blood pressures while on the medicine to take to her doctor.  Otherwise she appears stable for discharge home and we discussed return precautions.  Final Clinical Impressions(s) / ED Diagnoses   Final diagnoses:  Essential hypertension    ED Discharge Orders    None       Sherwood Gambler, MD 01/31/18 [phone]

## 2018-02-13 ENCOUNTER — Encounter (HOSPITAL_COMMUNITY): Payer: Self-pay | Admitting: Emergency Medicine

## 2018-02-13 ENCOUNTER — Other Ambulatory Visit: Payer: Self-pay

## 2018-02-13 ENCOUNTER — Emergency Department (HOSPITAL_COMMUNITY)
Admission: EM | Admit: 2018-02-13 | Discharge: 2018-02-14 | Disposition: A | Discharge status: Home / Self Care | Payer: 59 | Attending: Emergency Medicine | Admitting: Emergency Medicine

## 2018-02-13 ENCOUNTER — Emergency Department (HOSPITAL_COMMUNITY): Payer: 59

## 2018-02-13 DIAGNOSIS — Z5321 Procedure and treatment not carried out due to patient leaving prior to being seen by health care provider: Secondary | ICD-10-CM | POA: Insufficient documentation

## 2018-02-13 DIAGNOSIS — R51 Headache: Secondary | ICD-10-CM | POA: Insufficient documentation

## 2018-02-13 NOTE — ED Triage Notes (Signed)
Pt reports HA/HTN X a few weeks, pt has been seen for same. States pressure like feeling, also reports congestion. No neuro S/S, denies CP/SOB

## 2018-02-14 NOTE — ED Notes (Signed)
Pt to NF stating she is leaving

## 2018-04-10 ENCOUNTER — Encounter (HOSPITAL_COMMUNITY): Payer: Self-pay | Admitting: Emergency Medicine

## 2018-04-10 ENCOUNTER — Emergency Department (HOSPITAL_COMMUNITY)
Admission: EM | Admit: 2018-04-10 | Discharge: 2018-04-10 | Disposition: A | Discharge status: Home / Self Care | Payer: Self-pay | Attending: Emergency Medicine | Admitting: Emergency Medicine

## 2018-04-10 ENCOUNTER — Emergency Department (HOSPITAL_COMMUNITY): Payer: Self-pay

## 2018-04-10 DIAGNOSIS — J069 Acute upper respiratory infection, unspecified: Secondary | ICD-10-CM | POA: Insufficient documentation

## 2018-04-10 DIAGNOSIS — I10 Essential (primary) hypertension: Secondary | ICD-10-CM | POA: Insufficient documentation

## 2018-04-10 NOTE — ED Notes (Signed)
Bed: WTR7 Expected date:  Expected time:  Means of arrival:  Comments: EMS-nose bleed-triage

## 2018-04-10 NOTE — ED Triage Notes (Signed)
Patient here from home with complaints of cough, fever x1 week.

## 2018-04-10 NOTE — Discharge Instructions (Addendum)
Your vital signs are within normal limits today.  Your oxygen level is 99% on room air.  Your chest x-ray is negative for pneumonia, collapsed lung, or other emergent situations.  Your examination favors an upper respiratory infection.  Please wash hands frequently.  Please increase fluids.  You may continue over-the-counter medication that you are currently taking.  Please do not add any additional over-the-counter cold medications, unless they are approved for patients with high blood pressure.  Please see your primary physician in the office systems possible for follow-up and recheck.  Return to the emergency department if any emergent changes in your condition, problems, or concerns.

## 2018-04-10 NOTE — ED Provider Notes (Signed)
[facility] DEPT Provider Note   CSN: [id number] Arrival date & time: 04/10/18  1502     History   Chief Complaint Chief Complaint  Patient presents with  . Cough  . Fever    HPI Meagan Padilla is a 42 y.o. female.  The history is provided by the patient.  URI   This is a new problem. The current episode started more than 1 week ago. The problem has not changed since onset.The maximum temperature recorded prior to her arrival was 101 to 101.9 F. Associated symptoms include congestion and cough. Pertinent negatives include no chest pain, no abdominal pain, no diarrhea, no vomiting, no dysuria, no sinus pain, no neck pain and no wheezing. Treatments tried: Thera-Flu, tylenol.    Past Medical History:  Diagnosis Date  . Hypertension   . Murmur, cardiac     There are no active problems to display for this patient.   Past Surgical History:  Procedure Laterality Date  . DENTAL SURGERY       OB History   None      Home Medications    Prior to Admission medications   Medication Sig Start Date End Date Taking? Authorizing Provider  acetaminophen (TYLENOL) 500 MG tablet Take 500 mg by mouth every 6 (six) hours as needed for headache.     [provider]  atenolol-chlorthalidone (TENORETIC) 50-25 MG tablet Take 1 tablet by mouth daily.    [provider]  hydrochlorothiazide (HYDRODIURIL) 25 MG tablet Take 1 tablet (25 mg total) by mouth daily. Patient not taking: Reported on 01/31/2018 05/28/13   Riki Altes, MD  naproxen (NAPROSYN) 500 MG tablet Take 1 tablet (500 mg total) by mouth 2 (two) times daily as needed for mild pain, moderate pain or headache (TAKE WITH MEALS.). Patient not taking: Reported on 01/31/2018 06/22/14   Street, [place], PA-C    Family History Family History  Problem Relation Age of Onset  . Hypertension Mother   . Hypertension Father     Social History Social History   Tobacco Use  . Smoking  status: Never Smoker  . Smokeless tobacco: Never Used  Substance Use Topics  . Alcohol use: No  . Drug use: No     Allergies   Patient has no known allergies.   Review of Systems Review of Systems  Constitutional: Positive for fever. Negative for activity change.       All ROS Neg except as noted in HPI  HENT: Positive for congestion, postnasal drip and sinus pressure. Negative for nosebleeds and sinus pain.   Eyes: Negative for photophobia and discharge.  Respiratory: Positive for cough. Negative for shortness of breath and wheezing.   Cardiovascular: Negative for chest pain and palpitations.  Gastrointestinal: Negative for abdominal pain, blood in stool, diarrhea and vomiting.  Genitourinary: Negative for dysuria, frequency and hematuria.  Musculoskeletal: Negative for arthralgias, back pain and neck pain.  Skin: Negative.   Neurological: Negative for dizziness, seizures and speech difficulty.  Psychiatric/Behavioral: Negative for confusion and hallucinations.     Physical Exam Updated Vital Signs BP (!) 142/89 (BP Location: Right Arm)   Pulse 79   Temp 98.1 F (36.7 C) (Oral)   Resp 18   LMP 03/20/2018 (Approximate)   SpO2 99%   Physical Exam  Constitutional: She is oriented to person, place, and time. She appears well-developed and well-nourished.  Non-toxic appearance.  HENT:  Head: Normocephalic.  Right Ear: Tympanic membrane and external ear normal.  Left Ear: Tympanic membrane and external ear normal.  Nasal congestion present. Mild increased redness of the posterior pharynx.  Uvula is midline.  Airway is patent.  Eyes: Pupils are equal, round, and reactive to light. EOM and lids are normal.  Neck: Normal range of motion. Neck supple. Carotid bruit is not present.  Cardiovascular: Normal rate, regular rhythm, normal heart sounds, intact distal pulses and normal pulses.  Pulmonary/Chest: Breath sounds normal. No respiratory distress.  Patient speaks in  complete sentences without problem.  Abdominal: Soft. Bowel sounds are normal. There is no tenderness. There is no guarding.  Musculoskeletal: Normal range of motion.  Lymphadenopathy:       Head (right side): No submandibular adenopathy present.       Head (left side): No submandibular adenopathy present.    She has no cervical adenopathy.  Neurological: She is alert and oriented to person, place, and time. She has normal strength. No cranial nerve deficit or sensory deficit.  Skin: Skin is warm and dry.  Psychiatric: She has a normal mood and affect. Her speech is normal.  Nursing note and vitals reviewed.    ED Treatments / Results  Labs (all labs ordered are listed, but only abnormal results are displayed) Labs Reviewed - No data to display  EKG None  Radiology Dg Chest 2 View  Result Date: 04/10/2018 CLINICAL DATA:  Cough and fever for 1 week, history hypertension, heart murmur EXAM: CHEST - 2 VIEW COMPARISON:  06/22/2014 FINDINGS: Borderline enlargement of cardiac silhouette. Mediastinal contours and pulmonary vascularity normal. Lungs clear. No infiltrate, pleural effusion, or pneumothorax. Bones unremarkable. IMPRESSION: No acute abnormalities. Electronically Signed   By: Lavonia Dana M.D.   On: 04/10/2018 15:50    Procedures Procedures (including critical care time)  Medications Ordered in ED Medications - No data to display   Initial Impression / Assessment and Plan / ED Course  I have reviewed the triage vital signs and the nursing notes.  Pertinent labs & imaging results that were available during my care of the patient were reviewed by me and considered in my medical decision making (see chart for details).       Final Clinical Impressions(s) / ED Diagnoses MDM  Vital signs are within normal limits.  Pulse oximetry is 99% on room air.  Within normal limits by my interpretation.  The patient speaks in complete sentences without problem.  Patient has had some  temperature elevation, but no temperature elevation on today's visit.  Chest x-ray is negative for acute problems.  Doubt pneumonia, pleural effusion, or pneumothorax.  I suspect from the examination findings that the patient has an upper respiratory infection.  I have asked the patient to continue her TheraFlu, but not to add any additional over-the-counter medications unless they are hypertensive patient friendly.  We discussed the importance of good handwashing, and we discussed the importance of good hydration.  Patient acknowledges understanding and is in agreement with this plan.   Final diagnoses:  Upper respiratory tract infection, unspecified type    ED Discharge Orders    None       Lily Kocher, PA-C 04/10/18 1632    Charlesetta Shanks, MD 04/10/18 [phone]

## 2018-08-15 ENCOUNTER — Ambulatory Visit (HOSPITAL_COMMUNITY)
Admission: EM | Admit: 2018-08-15 | Discharge: 2018-08-15 | Disposition: A | Discharge status: Home / Self Care | Payer: Self-pay | Attending: Family Medicine | Admitting: Family Medicine

## 2018-08-15 ENCOUNTER — Emergency Department (HOSPITAL_COMMUNITY): Payer: Self-pay

## 2018-08-15 ENCOUNTER — Emergency Department (HOSPITAL_COMMUNITY)
Admission: EM | Admit: 2018-08-15 | Discharge: 2018-08-15 | Disposition: A | Discharge status: Home / Self Care | Payer: Self-pay | Attending: Emergency Medicine | Admitting: Emergency Medicine

## 2018-08-15 ENCOUNTER — Encounter (HOSPITAL_COMMUNITY): Payer: Self-pay

## 2018-08-15 ENCOUNTER — Encounter (HOSPITAL_COMMUNITY): Payer: Self-pay | Admitting: Emergency Medicine

## 2018-08-15 DIAGNOSIS — K529 Noninfective gastroenteritis and colitis, unspecified: Secondary | ICD-10-CM | POA: Insufficient documentation

## 2018-08-15 DIAGNOSIS — K5900 Constipation, unspecified: Secondary | ICD-10-CM

## 2018-08-15 DIAGNOSIS — Z79899 Other long term (current) drug therapy: Secondary | ICD-10-CM | POA: Insufficient documentation

## 2018-08-15 DIAGNOSIS — I10 Essential (primary) hypertension: Secondary | ICD-10-CM | POA: Insufficient documentation

## 2018-08-15 DIAGNOSIS — R1084 Generalized abdominal pain: Secondary | ICD-10-CM

## 2018-08-15 LAB — CBC
HCT: 41.9 % (ref 36.0–46.0)
HEMOGLOBIN: 12.8 g/dL (ref 12.0–15.0)
MCH: 25.1 pg — ABNORMAL LOW (ref 26.0–34.0)
MCHC: 30.5 g/dL (ref 30.0–36.0)
MCV: 82.2 fL (ref 80.0–100.0)
Platelets: 277 10*3/uL (ref 150–400)
RBC: 5.1 MIL/uL (ref 3.87–5.11)
RDW: 15.9 % — ABNORMAL HIGH (ref 11.5–15.5)
WBC: 10.3 10*3/uL (ref 4.0–10.5)
nRBC: 0 % (ref 0.0–0.2)

## 2018-08-15 LAB — URINALYSIS, ROUTINE W REFLEX MICROSCOPIC
BACTERIA UA: NONE SEEN
BILIRUBIN URINE: NEGATIVE
GLUCOSE, UA: NEGATIVE mg/dL
HGB URINE DIPSTICK: NEGATIVE
KETONES UR: 5 mg/dL — AB
NITRITE: NEGATIVE
PROTEIN: NEGATIVE mg/dL
Specific Gravity, Urine: 1.019 (ref 1.005–1.030)
pH: 8 (ref 5.0–8.0)

## 2018-08-15 LAB — COMPREHENSIVE METABOLIC PANEL
ALBUMIN: 4.3 g/dL (ref 3.5–5.0)
ALT: 13 U/L (ref 0–44)
AST: 26 U/L (ref 15–41)
Alkaline Phosphatase: 88 U/L (ref 38–126)
Anion gap: 9 (ref 5–15)
BILIRUBIN TOTAL: 0.3 mg/dL (ref 0.3–1.2)
BUN: 6 mg/dL (ref 6–20)
CO2: 23 mmol/L (ref 22–32)
Calcium: 9.3 mg/dL (ref 8.9–10.3)
Chloride: 101 mmol/L (ref 98–111)
Creatinine, Ser: 0.72 mg/dL (ref 0.44–1.00)
GFR calc Af Amer: >60 mL/min (ref >60)
Glucose, Bld: 120 mg/dL — ABNORMAL HIGH (ref 70–99)
POTASSIUM: 3.7 mmol/L (ref 3.5–5.1)
Sodium: 133 mmol/L — ABNORMAL LOW (ref 135–145)
TOTAL PROTEIN: 8.9 g/dL — AB (ref 6.5–8.1)

## 2018-08-15 LAB — LIPASE, BLOOD: LIPASE: 38 U/L (ref 11–51)

## 2018-08-15 LAB — I-STAT BETA HCG BLOOD, ED (MC, WL, AP ONLY): I-stat hCG, quantitative: <5 m[IU]/mL (ref <5)

## 2018-08-15 LAB — POCT PREGNANCY, URINE: PREG TEST UR: NEGATIVE

## 2018-08-15 MED ORDER — METOCLOPRAMIDE HCL 10 MG PO TABS: 10.0000 mg | ORAL_TABLET | Freq: Four times a day (QID) | ORAL | 0 refills | Status: DC | PRN

## 2018-08-15 MED ORDER — SODIUM CHLORIDE 0.9 % IV BOLUS
1000.0000 mL | Freq: Once | INTRAVENOUS | Status: AC
  Administered 2018-08-15: 1000 mL via INTRAVENOUS

## 2018-08-15 MED ORDER — POLYETHYLENE GLYCOL 3350 17 G PO PACK: 17.0000 g | PACK | Freq: Every day | ORAL | 0 refills | Status: DC

## 2018-08-15 MED ORDER — ONDANSETRON HCL 4 MG/2ML IJ SOLN
4.0000 mg | Freq: Once | INTRAMUSCULAR | Status: AC
  Administered 2018-08-15: 4 mg via INTRAVENOUS
  Filled 2018-08-15: qty 2

## 2018-08-15 MED ORDER — DOCUSATE SODIUM 50 MG PO CAPS: 50.0000 mg | ORAL_CAPSULE | Freq: Two times a day (BID) | ORAL | 0 refills | Status: DC

## 2018-08-15 NOTE — ED Provider Notes (Signed)
[facility] DEPT Provider Note   CSN: [id number] Arrival date & time: 08/15/18  1649     History   Chief Complaint Chief Complaint  Patient presents with  . Abdominal Pain  . Constipation    HPI Meagan Padilla is a 42 y.o. female hx of HTN, here presenting with abdominal pain, constipation, and vomiting.  Patient woke up this morning with some of the abdominal pain and distention.  She states that she is constipated and last bowel movement was 2 days ago.  Went to urgent care when was told to take some MiraLAX and senna.  He went home and then she had 2 episodes of vomiting and worsening abdominal cramps.  She also states that she has low-grade temperature at home but did not take her temperature.  Denies any urinary symptoms.  Denies any sick contacts or recent travel or bad food.  Patient denies any previous abdominal surgeries.   The history is provided by the patient.    Past Medical History:  Diagnosis Date  . Hypertension   . Murmur, cardiac     There are no active problems to display for this patient.   Past Surgical History:  Procedure Laterality Date  . DENTAL SURGERY       OB History   None      Home Medications    Prior to Admission medications   Medication Sig Start Date End Date Taking? Authorizing Provider  acetaminophen (TYLENOL) 500 MG tablet Take 500 mg by mouth every 6 (six) hours as needed for headache.    Yes [provider]  atenolol-chlorthalidone (TENORETIC) 50-25 MG tablet Take 1 tablet by mouth daily.   Yes [provider]  polyethylene glycol (MIRALAX) packet Take 17 g by mouth daily. 08/15/18  Yes Yu, Amy V, PA-C  docusate sodium (COLACE) 50 MG capsule Take 1 capsule (50 mg total) by mouth 2 (two) times daily. 08/15/18   Tasia Catchings, Amy V, PA-C  hydrochlorothiazide (HYDRODIURIL) 25 MG tablet Take 1 tablet (25 mg total) by mouth daily. Patient not taking: Reported on 01/31/2018 05/28/13   Riki Altes, MD  naproxen (NAPROSYN) 500 MG tablet Take 1 tablet (500 mg total) by mouth 2 (two) times daily as needed for mild pain, moderate pain or headache (TAKE WITH MEALS.). Patient not taking: Reported on 01/31/2018 06/22/14   Street, [place], PA-C    Family History Family History  Problem Relation Age of Onset  . Hypertension Mother   . Hypertension Father     Social History Social History   Tobacco Use  . Smoking status: Never Smoker  . Smokeless tobacco: Never Used  Substance Use Topics  . Alcohol use: No  . Drug use: No     Allergies   Patient has no known allergies.   Review of Systems Review of Systems  Gastrointestinal: Positive for abdominal pain and constipation.  All other systems reviewed and are negative.    Physical Exam Updated Vital Signs BP 134/80 (BP Location: Right Arm)   Pulse 79   Temp 98.4 F (36.9 C) (Oral)   Resp 16   LMP 07/31/2018   SpO2 99%   Physical Exam  Constitutional: She is oriented to person, place, and time. She appears well-developed and well-nourished.  HENT:  Head: Normocephalic.  MM slightly dry   Eyes: Pupils are equal, round, and reactive to light. EOM are normal.  Cardiovascular: Normal rate and regular rhythm.  Pulmonary/Chest: Effort normal and breath sounds normal.  Abdominal: Normal appearance.  Slightly distended, nontender   Neurological: She is alert and oriented to person, place, and time.  Skin: Skin is warm. Capillary refill takes less than 2 seconds.  Psychiatric: She has a normal mood and affect. Her behavior is normal.  Nursing note and vitals reviewed.    ED Treatments / Results  Labs (all labs ordered are listed, but only abnormal results are displayed) Labs Reviewed  COMPREHENSIVE METABOLIC PANEL - Abnormal; Notable for the following components:      Result Value   Sodium 133 (*)    Glucose, Bld 120 (*)    Total Protein 8.9 (*)    All other components within normal limits  CBC - Abnormal;  Notable for the following components:   MCH 25.1 (*)    RDW 15.9 (*)    All other components within normal limits  URINALYSIS, ROUTINE W REFLEX MICROSCOPIC - Abnormal; Notable for the following components:   APPearance HAZY (*)    Ketones, ur 5 (*)    Leukocytes, UA MODERATE (*)    All other components within normal limits  LIPASE, BLOOD  I-STAT BETA HCG BLOOD, ED ([facility], [facility], [facility] ONLY)    EKG None  Radiology Dg Abd Acute W/chest  Result Date: 08/15/2018 CLINICAL DATA:  42 y/o  F; abdominal pain, nausea, vomiting. EXAM: DG ABDOMEN ACUTE W/ 1V CHEST COMPARISON:  04/10/2018 chest radiograph FINDINGS: Mildly dilated loops of small bowel in the left upper abdomen. No radiopaque calculi or other significant radiographic abnormality is seen. Heart size and mediastinal contours are within normal limits and stable. Both lungs are clear. IMPRESSION: 1. Mildly dilated loops of small bowel in the left upper abdomen may represent partial obstruction, ileus, or enteritis. 2. Clear lungs. Electronically Signed   By: Kristine Garbe M.D.   On: 08/15/2018 20:16    Procedures Procedures (including critical care time)  Medications Ordered in ED Medications  sodium chloride 0.9 % bolus 1,000 mL (1,000 mLs Intravenous New Bag/Given 08/15/18 1958)  ondansetron (ZOFRAN) injection 4 mg (4 mg Intravenous Given 08/15/18 2013)     Initial Impression / Assessment and Plan / ED Course  I have reviewed the triage vital signs and the nursing notes.  Pertinent labs & imaging results that were available during my care of the patient were reviewed by me and considered in my medical decision making (see chart for details).    [name] is a 42 y.o. female here with constipation, vomiting. I think likely viral gastro vs ileus. Low suspicion for SBO. Will get labs, acute abdominal series, UA. Will hydrate and reassess.   9:18 PM Labs unremarkable. Xray showed ileus vs enteritis. Abdomen soft, no  vomiting after zofran. I doubt SBO. I think likely enteritis. Will dc home with reglan prn.    Final Clinical Impressions(s) / ED Diagnoses   Final diagnoses:  None    ED Discharge Orders    None       Drenda Freeze, MD 08/15/18 2119

## 2018-08-15 NOTE — ED Triage Notes (Signed)
Pt presents with generalized abdominal pain , not associated with anything in particular.  Pt also states bowel movements have been a little abnormal.

## 2018-08-15 NOTE — ED Triage Notes (Signed)
Per [facility] pt from urgent care for abd pains that started this morning with nausea after taking Alka-Seltzer. Denies vomiting or diarrhea.

## 2018-08-15 NOTE — Discharge Instructions (Signed)
Urine pregnancy negative.  As discussed, your symptoms could be due to constipation.  Start MiraLAX as directed.  You can use Colace, but this can worsen your cramping as a side effect. Keep hydrated, your urine should be clear to pale yellow in color.  If experiencing worsening symptoms, nausea, vomiting, worsening abdominal pain and cannot walk or jump, go to the emergency department for further evaluation.

## 2018-08-15 NOTE — Discharge Instructions (Signed)
Stay hydrated.   Take reglan for nausea.   If you are still constipated tomorrow, you can take miralax. However, you might have a stomach virus so may start having loose stools soon.   Return to ER if you have worse abdominal pain, vomiting, fever, dehydration

## 2018-08-15 NOTE — ED Notes (Signed)
Per Dr Darl Householder pt can leave once fluids are done.

## 2018-08-15 NOTE — ED Provider Notes (Signed)
[place]    CSN: [id number] Arrival date & time: 08/15/18  0254     History   Chief Complaint Chief Complaint  Patient presents with  . Abdominal Pain    HPI Meagan Padilla is a 42 y.o. female.   42 year old female comes in for 1 day history of abdominal cramping.  Cramping is generalized, intermittent, without obvious aggravating or alleviating factor.  Patient states it feels like she needs to move her bowels, but has been having constipation for the past few days.  States her last bowel movement was 2 days ago with small hard stools. Still passing flatus. Denies nausea, vomiting.  Denies urinary symptoms such as frequency, dysuria, hematuria.  Denies vaginal symptoms such as discharge, itching, pain, spotting.  Denies fever, chills, night sweats.  Denies recent travels.  LMP 07/31/2018.  Denies history of abdominal surgeries.     Past Medical History:  Diagnosis Date  . Hypertension   . Murmur, cardiac     There are no active problems to display for this patient.   Past Surgical History:  Procedure Laterality Date  . DENTAL SURGERY      OB History   None      Home Medications    Prior to Admission medications   Medication Sig Start Date End Date Taking? Authorizing Provider  acetaminophen (TYLENOL) 500 MG tablet Take 500 mg by mouth every 6 (six) hours as needed for headache.     [provider]  atenolol-chlorthalidone (TENORETIC) 50-25 MG tablet Take 1 tablet by mouth daily.    [provider]  docusate sodium (COLACE) 50 MG capsule Take 1 capsule (50 mg total) by mouth 2 (two) times daily. 08/15/18   Tasia Catchings, Comfort Iversen V, PA-C  hydrochlorothiazide (HYDRODIURIL) 25 MG tablet Take 1 tablet (25 mg total) by mouth daily. Patient not taking: Reported on 01/31/2018 05/28/13   Riki Altes, MD  naproxen (NAPROSYN) 500 MG tablet Take 1 tablet (500 mg total) by mouth 2 (two) times daily as needed for mild pain, moderate pain or headache (TAKE  WITH MEALS.). Patient not taking: Reported on 01/31/2018 06/22/14   Street, [place], PA-C  polyethylene glycol [facility]) packet Take 17 g by mouth daily. 08/15/18   Ok Edwards, PA-C    Family History Family History  Problem Relation Age of Onset  . Hypertension Mother   . Hypertension Father     Social History Social History   Tobacco Use  . Smoking status: Never Smoker  . Smokeless tobacco: Never Used  Substance Use Topics  . Alcohol use: No  . Drug use: No     Allergies   Patient has no known allergies.   Review of Systems Review of Systems  Reason unable to perform ROS: See HPI as above.     Physical Exam Triage Vital Signs ED Triage Vitals  Enc Vitals Group     BP 08/15/18 1024 (!) 171/83     Pulse Rate 08/15/18 1024 (!) 110     Resp 08/15/18 1024 20     Temp 08/15/18 1024 98 F (36.7 C)     Temp Source 08/15/18 1024 Oral     SpO2 08/15/18 1024 100 %     Weight --      Height --      Head Circumference --      Peak Flow --      Pain Score 08/15/18 1025 6     Pain Loc --  Pain Edu? --      Excl. in [place]? --    No data found.  Updated Vital Signs BP (!) 171/83 (BP Location: Right Arm)   Pulse (!) 110   Temp 98 F (36.7 C) (Oral)   Resp 20   LMP 07/31/2018   SpO2 100%   Physical Exam  Constitutional: She is oriented to person, place, and time. She appears well-developed and well-nourished.  Non-toxic appearance. She does not appear ill. No distress.  HENT:  Head: Normocephalic and atraumatic.  Eyes: Pupils are equal, round, and reactive to light. Conjunctivae are normal.  Cardiovascular: Normal rate, regular rhythm and normal heart sounds. Exam reveals no gallop and no friction rub.  No murmur heard. Pulmonary/Chest: Effort normal and breath sounds normal. She has no wheezes. She has no rales.  Abdominal: Soft. Bowel sounds are normal. She exhibits no mass. There is no tenderness. There is no rigidity, no rebound, no guarding and no CVA  tenderness.  Neurological: She is alert and oriented to person, place, and time.  Skin: Skin is warm and dry.  Psychiatric: She has a normal mood and affect. Her behavior is normal. Judgment normal.     UC Treatments / Results  Labs (all labs ordered are listed, but only abnormal results are displayed) Labs Reviewed  POCT PREGNANCY, URINE    EKG None  Radiology No results found.  Procedures Procedures (including critical care time)  Medications Ordered in UC Medications - No data to display  Initial Impression / Assessment and Plan / UC Course  I have reviewed the triage vital signs and the nursing notes.  Pertinent labs & imaging results that were available during my care of the patient were reviewed by me and considered in my medical decision making (see chart for details).    Urine pregnancy negative.  Exam unremarkable.  Will treat for constipation with MiraLAX and Colace.  Push fluids.  Return precautions given.  Patient expresses understanding and agrees to plan.  Final Clinical Impressions(s) / UC Diagnoses   Final diagnoses:  Constipation, unspecified constipation type  Generalized abdominal pain    ED Prescriptions    Medication Sig Dispense Auth. Provider   polyethylene glycol (MIRALAX) packet Take 17 g by mouth daily. 14 each Oryn Casanova V, PA-C   docusate sodium (COLACE) 50 MG capsule Take 1 capsule (50 mg total) by mouth 2 (two) times daily. 10 capsule Tobin Chad, [place] 08/15/18 1144

## 2018-09-07 ENCOUNTER — Other Ambulatory Visit: Payer: Self-pay

## 2018-09-07 ENCOUNTER — Ambulatory Visit (HOSPITAL_COMMUNITY)
Admission: EM | Admit: 2018-09-07 | Discharge: 2018-09-07 | Disposition: A | Discharge status: Home / Self Care | Payer: Self-pay | Attending: Family Medicine | Admitting: Family Medicine

## 2018-09-07 ENCOUNTER — Encounter (HOSPITAL_COMMUNITY): Payer: Self-pay | Admitting: Emergency Medicine

## 2018-09-07 DIAGNOSIS — M542 Cervicalgia: Secondary | ICD-10-CM

## 2018-09-07 DIAGNOSIS — S161XXA Strain of muscle, fascia and tendon at neck level, initial encounter: Secondary | ICD-10-CM

## 2018-09-07 MED ORDER — NAPROXEN 500 MG PO TABS: 500.0000 mg | ORAL_TABLET | Freq: Two times a day (BID) | ORAL | 0 refills | Status: DC | PRN

## 2018-09-07 MED ORDER — CYCLOBENZAPRINE HCL 10 MG PO TABS: ORAL_TABLET | ORAL | 0 refills | Status: DC

## 2018-09-07 NOTE — ED Provider Notes (Signed)
[place]    CSN: [id number] Arrival date & time: 09/07/18  1002     History   Chief Complaint Chief Complaint  Patient presents with  . Neck Pain    HPI Meagan Padilla is a 42 y.o. female.   42 year old female presents with right neck pain. She was sitting on the floor about 1 week ago and looking up at her computer and TV screen with her neck turned to her right slightly for a few hours. Shortly afterwards, she started to experience right sided neck pain. The pain has persisted despite apply heating pad and cold compresses as well as taking a dose of Tylenol. Area feels like something is "pulling" on her neck. She denies any radiation of pain or numbness. No previous injury to neck. Has history of HTN and constipation and currently on Tenoretic daily (did not take yet today), Colace and Miralax prn.   The history is provided by the patient.    Past Medical History:  Diagnosis Date  . Hypertension   . Murmur, cardiac     There are no active problems to display for this patient.   Past Surgical History:  Procedure Laterality Date  . DENTAL SURGERY      OB History   None      Home Medications    Prior to Admission medications   Medication Sig Start Date End Date Taking? Authorizing Provider  acetaminophen (TYLENOL) 500 MG tablet Take 500 mg by mouth every 6 (six) hours as needed for headache.     [provider]  atenolol-chlorthalidone (TENORETIC) 50-25 MG tablet Take 1 tablet by mouth daily.    [provider]  cyclobenzaprine (FLEXERIL) 10 MG tablet Take 1/2 to 1 whole tablet by mouth every 8 hours as needed for muscle spasms/pain. 09/07/18   Katy Apo, NP  docusate sodium (COLACE) 50 MG capsule Take 1 capsule (50 mg total) by mouth 2 (two) times daily. 08/15/18   Tasia Catchings, Amy V, PA-C  naproxen (NAPROSYN) 500 MG tablet Take 1 tablet (500 mg total) by mouth 2 (two) times daily as needed for moderate pain. 09/07/18   Katy Apo, NP  polyethylene glycol [facility]) packet Take 17 g by mouth daily. 08/15/18   Ok Edwards, PA-C    Family History Family History  Problem Relation Age of Onset  . Hypertension Mother   . Hypertension Father     Social History Social History   Tobacco Use  . Smoking status: Never Smoker  . Smokeless tobacco: Never Used  Substance Use Topics  . Alcohol use: No  . Drug use: No     Allergies   Patient has no known allergies.   Review of Systems Review of Systems  Constitutional: Negative for activity change, appetite change, chills, fatigue and fever.  HENT: Negative for mouth sores, sore throat and trouble swallowing.   Respiratory: Negative for cough, shortness of breath and wheezing.   Gastrointestinal: Negative for nausea and vomiting.  Musculoskeletal: Positive for arthralgias, myalgias and neck pain. Negative for neck stiffness.  Skin: Negative for color change, rash and wound.  Neurological: Negative for dizziness, tremors, seizures, syncope, speech difficulty, weakness, light-headedness, numbness and headaches.  Hematological: Negative for adenopathy. Does not bruise/bleed easily.     Physical Exam Triage Vital Signs ED Triage Vitals  Enc Vitals Group     BP 09/07/18 1019 (!) 143/92     Pulse Rate 09/07/18 1019 71  Resp 09/07/18 1019 18     Temp 09/07/18 1019 97.7 F (36.5 C)     Temp Source 09/07/18 1019 Oral     SpO2 09/07/18 1019 100 %     Weight --      Height --      Head Circumference --      Peak Flow --      Pain Score 09/07/18 1016 3     Pain Loc --      Pain Edu? --      Excl. in [place]? --    No data found.  Updated Vital Signs BP (!) 143/92 (BP Location: Left Arm) Comment: has not had blood pressure medicine this morning  Pulse 71   Temp 97.7 F (36.5 C) (Oral)   Resp 18   LMP 08/24/2018   SpO2 100%   Visual Acuity Right Eye Distance:   Left Eye Distance:   Bilateral Distance:    Right Eye Near:   Left Eye Near:      Bilateral Near:     Physical Exam  Constitutional: She is oriented to person, place, and time. She appears well-developed and well-nourished. She is cooperative. She does not appear ill. No distress.  Patient sitting comfortably on exam table in no acute distress.   HENT:  Head: Normocephalic and atraumatic.  Right Ear: Hearing and external ear normal.  Left Ear: Hearing and external ear normal.  Nose: Nose normal.  Eyes: Conjunctivae and EOM are normal.  Neck: Trachea normal and normal range of motion. Neck supple. Muscular tenderness present. No spinous process tenderness present. No neck rigidity.    Has full range of motion of neck but pain with rotation to the right and flexion. No swelling, redness or rash seen. Tender along right Paraspinous muscle group.   Cardiovascular: Normal rate, regular rhythm and normal heart sounds.  No murmur heard. Pulmonary/Chest: Effort normal and breath sounds normal. No respiratory distress. She has no decreased breath sounds. She has no wheezes. She has no rhonchi.  Musculoskeletal: Normal range of motion. She exhibits tenderness. She exhibits no edema.  Lymphadenopathy:    She has no cervical adenopathy.  Neurological: She is alert and oriented to person, place, and time. She has normal strength and normal reflexes. No cranial nerve deficit or sensory deficit.  Skin: Skin is warm and dry. Capillary refill takes less than 2 seconds. No rash noted.  Psychiatric: She has a normal mood and affect. Her behavior is normal. Judgment and thought content normal.  Vitals reviewed.    UC Treatments / Results  Labs (all labs ordered are listed, but only abnormal results are displayed) Labs Reviewed - No data to display  EKG None  Radiology No results found.  Procedures Procedures (including critical care time)  Medications Ordered in UC Medications - No data to display  Initial Impression / Assessment and Plan / UC Course  I have reviewed  the triage vital signs and the nursing notes.  Pertinent labs & imaging results that were available during my care of the patient were reviewed by me and considered in my medical decision making (see chart for details).    Discussed with patient that she probably has a muscle strain. Recommend start Naproxen 500mg  twice a day as directed. May take Flexeril 10mg  1/2 to 1 whole tablet every 8 hours as needed for muscle pain/spasms. Encouraged to apply warm compresses to area for comfort. Follow-up with her PCP in 5 days if not improving.  Final Clinical Impressions(s) / UC Diagnoses   Final diagnoses:  Strain of neck muscle, initial encounter  Neck pain on right side     Discharge Instructions     Recommend start Naproxen 500mg  twice a day as directed for pain. May take Flexeril muscle relaxer 1/2 to 1 whole tablet every 8 hours as needed for muscle spasms/pain- may cause drowsiness. Continue to apply warm compresses to area for comfort. Follow-up in 4 to 5 days with your PCP if not improving.     ED Prescriptions    Medication Sig Dispense Auth. Provider   naproxen (NAPROSYN) 500 MG tablet Take 1 tablet (500 mg total) by mouth 2 (two) times daily as needed for moderate pain. 20 tablet Katy Apo, NP   cyclobenzaprine (FLEXERIL) 10 MG tablet Take 1/2 to 1 whole tablet by mouth every 8 hours as needed for muscle spasms/pain. 15 tablet Katy Apo, NP     Controlled Substance Prescriptions [place] Controlled Substance Registry consulted? Not Applicable   Katy Apo, NP 09/08/18 1053

## 2018-09-07 NOTE — Discharge Instructions (Addendum)
Recommend start Naproxen 500mg  twice a day as directed for pain. May take Flexeril muscle relaxer 1/2 to 1 whole tablet every 8 hours as needed for muscle spasms/pain- may cause drowsiness. Continue to apply warm compresses to area for comfort. Follow-up in 4 to 5 days with your PCP if not improving.

## 2018-09-07 NOTE — ED Triage Notes (Signed)
Last week patient remembers straining neck by sitting on floor and had head turned to the right .  Since then has had pain in right side of neck.

## 2018-09-14 ENCOUNTER — Encounter (HOSPITAL_COMMUNITY): Payer: Self-pay | Admitting: *Deleted

## 2018-09-14 ENCOUNTER — Ambulatory Visit (HOSPITAL_COMMUNITY)
Admission: EM | Admit: 2018-09-14 | Discharge: 2018-09-14 | Disposition: A | Discharge status: Home / Self Care | Payer: Self-pay | Attending: Family Medicine | Admitting: Family Medicine

## 2018-09-14 DIAGNOSIS — H6980 Other specified disorders of Eustachian tube, unspecified ear: Secondary | ICD-10-CM

## 2018-09-14 MED ORDER — FLUTICASONE PROPIONATE 50 MCG/ACT NA SUSP: 1.0000 | Freq: Every day | NASAL | 0 refills | Status: DC

## 2018-09-14 NOTE — Discharge Instructions (Signed)
Drink plenty of fluids. Use Flonase for a few days to reduce congestion in the nose, sinuses, ears All up with your primary care provider if the hearing and ringing in the ear does not improve

## 2018-09-14 NOTE — ED Triage Notes (Signed)
C/O left ear fullness with slight tinnitus since yesterday without pain.

## 2018-09-14 NOTE — ED Provider Notes (Signed)
[place]    CSN: [id number] Arrival date & time: 09/14/18  1018     History   Chief Complaint Chief Complaint  Patient presents with  . Ear Fullness    HPI Meagan Padilla is a 42 y.o. female.   HPI  Patient states she is otherwise well.  She has had some slight feeling of ear fullness bilaterally, left greater than right since yesterday.  No cough cold runny nose.  No sore throat.  No headache.  No congestion.  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  She thinks this is what is going on.  Past Medical History:  Diagnosis Date  . Hypertension   . Murmur, cardiac     There are no active problems to display for this patient.   Past Surgical History:  Procedure Laterality Date  . DENTAL SURGERY      OB History   None      Home Medications    Prior to Admission medications   Medication Sig Start Date End Date Taking? Authorizing Provider  atenolol-chlorthalidone (TENORETIC) 50-25 MG tablet Take 1 tablet by mouth daily.   Yes [provider]  cyclobenzaprine (FLEXERIL) 10 MG tablet Take 1/2 to 1 whole tablet by mouth every 8 hours as needed for muscle spasms/pain. 09/07/18  Yes Amyot, Nicholes Stairs, NP  acetaminophen (TYLENOL) 500 MG tablet Take 500 mg by mouth every 6 (six) hours as needed for headache.     [provider]  fluticasone (FLONASE) 50 MCG/ACT nasal spray Place 1 spray into both nostrils daily. 09/14/18   Raylene Everts, MD    Family History Family History  Problem Relation Age of Onset  . Hypertension Mother   . Hypertension Father     Social History Social History   Tobacco Use  . Smoking status: Never Smoker  . Smokeless tobacco: Never Used  Substance Use Topics  . Alcohol use: No  . Drug use: No     Allergies   Patient has no known allergies.   Review of Systems Review of Systems  Constitutional: Negative for chills and fever.  HENT: Positive for hearing loss and tinnitus.  Negative for ear pain and sore throat.   Eyes: Negative for pain and visual disturbance.  Respiratory: Negative for cough and shortness of breath.   Cardiovascular: Negative for chest pain and palpitations.  Gastrointestinal: Negative for abdominal pain and vomiting.  Genitourinary: Negative for dysuria and hematuria.  Musculoskeletal: Negative for arthralgias and back pain.  Skin: Negative for color change and rash.  Neurological: Negative for seizures and syncope.  All other systems reviewed and are negative.    Physical Exam Triage Vital Signs ED Triage Vitals  Enc Vitals Group     BP 09/14/18 1107 129/77     Pulse Rate 09/14/18 1107 88     Resp --      Temp 09/14/18 1107 98 F (36.7 C)     Temp src --      SpO2 09/14/18 1107 99 %     Weight --      Height --      Head Circumference --      Peak Flow --      Pain Score 09/14/18 1108 0     Pain Loc --      Pain Edu? --      Excl. in [place]? --    No data found.  Updated Vital Signs BP 129/77  Pulse 88   Temp 98 F (36.7 C)   LMP 08/24/2018 (Exact Date)   SpO2 99%      Physical Exam  Constitutional: She appears well-developed and well-nourished. No distress.  HENT:  Head: Normocephalic and atraumatic.  Left Ear: External ear normal.  Mouth/Throat: Oropharynx is clear and moist.  Right canal is occluded by cerumen.  This is lavaged and curetted clear.  Both TMs are clear.  Eyes: Pupils are equal, round, and reactive to light. Conjunctivae are normal.  Neck: Normal range of motion.  Cardiovascular: Normal rate.  Pulmonary/Chest: Effort normal. No respiratory distress.  Abdominal: Soft. She exhibits no distension.  Musculoskeletal: Normal range of motion. She exhibits no edema.  Lymphadenopathy:    She has no cervical adenopathy.  Neurological: She is alert.  Skin: Skin is warm and dry.  Psychiatric: She has a normal mood and affect. Her behavior is normal.     UC Treatments / Results  Labs (all labs  ordered are listed, but only abnormal results are displayed) Labs Reviewed - No data to display  EKG None  Radiology No results found.  Procedures Procedures (including critical care time)  Medications Ordered in UC Medications - No data to display  Initial Impression / Assessment and Plan / UC Course  I have reviewed the triage vital signs and the nursing notes.  Pertinent labs & imaging results that were available during my care of the patient were reviewed by me and considered in my medical decision making (see chart for details).     Final Clinical Impressions(s) / UC Diagnoses   Final diagnoses:  Dysfunction of Eustachian tube, unspecified laterality     Discharge Instructions     Drink plenty of fluids. Use Flonase for a few days to reduce congestion in the nose, sinuses, ears All up with your primary care provider if the hearing and ringing in the ear does not improve    ED Prescriptions    Medication Sig Dispense Auth. Provider   fluticasone (FLONASE) 50 MCG/ACT nasal spray Place 1 spray into both nostrils daily. 16 g Raylene Everts, MD     Controlled Substance Prescriptions [place] Controlled Substance Registry consulted? Not Applicable   Raylene Everts, MD 09/14/18 [phone]

## 2019-07-16 ENCOUNTER — Other Ambulatory Visit: Payer: Self-pay

## 2019-07-16 ENCOUNTER — Encounter (HOSPITAL_COMMUNITY): Payer: Self-pay

## 2019-07-16 ENCOUNTER — Emergency Department (HOSPITAL_COMMUNITY)
Admission: EM | Admit: 2019-07-16 | Discharge: 2019-07-16 | Discharge status: Against Medical Advice | Payer: PRIVATE HEALTH INSURANCE | Attending: Emergency Medicine | Admitting: Emergency Medicine

## 2019-07-16 DIAGNOSIS — R197 Diarrhea, unspecified: Secondary | ICD-10-CM | POA: Diagnosis present

## 2019-07-16 DIAGNOSIS — Z5321 Procedure and treatment not carried out due to patient leaving prior to being seen by health care provider: Secondary | ICD-10-CM | POA: Diagnosis not present

## 2019-07-16 LAB — I-STAT BETA HCG BLOOD, ED (MC, WL, AP ONLY): I-stat hCG, quantitative: <5 m[IU]/mL (ref <5)

## 2019-07-16 LAB — COMPREHENSIVE METABOLIC PANEL
ALT: 14 U/L (ref 0–44)
AST: 25 U/L (ref 15–41)
Albumin: 4.3 g/dL (ref 3.5–5.0)
Alkaline Phosphatase: 82 U/L (ref 38–126)
Anion gap: 11 (ref 5–15)
BUN: 5 mg/dL — ABNORMAL LOW (ref 6–20)
CO2: 19 mmol/L — ABNORMAL LOW (ref 22–32)
Calcium: 9.5 mg/dL (ref 8.9–10.3)
Chloride: 104 mmol/L (ref 98–111)
Creatinine, Ser: 0.71 mg/dL (ref 0.44–1.00)
GFR calc Af Amer: >60 mL/min (ref >60)
GFR calc non Af Amer: >60 mL/min (ref >60)
Glucose, Bld: 91 mg/dL (ref 70–99)
Potassium: 4 mmol/L (ref 3.5–5.1)
Sodium: 134 mmol/L — ABNORMAL LOW (ref 135–145)
Total Bilirubin: 0.3 mg/dL (ref 0.3–1.2)
Total Protein: 8.6 g/dL — ABNORMAL HIGH (ref 6.5–8.1)

## 2019-07-16 LAB — CBC
HCT: 42.9 % (ref 36.0–46.0)
Hemoglobin: 13.1 g/dL (ref 12.0–15.0)
MCH: 25 pg — ABNORMAL LOW (ref 26.0–34.0)
MCHC: 30.5 g/dL (ref 30.0–36.0)
MCV: 82 fL (ref 80.0–100.0)
Platelets: 210 10*3/uL (ref 150–400)
RBC: 5.23 MIL/uL — ABNORMAL HIGH (ref 3.87–5.11)
RDW: 16.7 % — ABNORMAL HIGH (ref 11.5–15.5)
WBC: 3.7 10*3/uL — ABNORMAL LOW (ref 4.0–10.5)
nRBC: 0 % (ref 0.0–0.2)

## 2019-07-16 LAB — LIPASE, BLOOD: Lipase: 26 U/L (ref 11–51)

## 2019-07-16 MED ORDER — SODIUM CHLORIDE 0.9% FLUSH: 3.0000 mL | Freq: Once | INTRAVENOUS | Status: DC

## 2019-07-16 NOTE — ED Triage Notes (Signed)
Patient c/o cold symptoms and diarrhea since yesterday. Patient states she had a productive cough with green sputum.

## 2019-07-16 NOTE — ED Notes (Addendum)
Per registration, patient turned in her labels and said she is leaving.

## 2019-07-21 ENCOUNTER — Other Ambulatory Visit: Payer: Self-pay

## 2019-07-21 ENCOUNTER — Emergency Department (HOSPITAL_COMMUNITY)
Admission: EM | Admit: 2019-07-21 | Discharge: 2019-07-21 | Disposition: A | Discharge status: Home / Self Care | Payer: No Typology Code available for payment source | Attending: Emergency Medicine | Admitting: Emergency Medicine

## 2019-07-21 DIAGNOSIS — J069 Acute upper respiratory infection, unspecified: Secondary | ICD-10-CM | POA: Insufficient documentation

## 2019-07-21 DIAGNOSIS — I10 Essential (primary) hypertension: Secondary | ICD-10-CM | POA: Diagnosis not present

## 2019-07-21 DIAGNOSIS — U071 COVID-19: Secondary | ICD-10-CM | POA: Insufficient documentation

## 2019-07-21 DIAGNOSIS — R0981 Nasal congestion: Secondary | ICD-10-CM | POA: Diagnosis present

## 2019-07-21 DIAGNOSIS — Z79899 Other long term (current) drug therapy: Secondary | ICD-10-CM | POA: Diagnosis not present

## 2019-07-21 MED ORDER — FLUTICASONE PROPIONATE 50 MCG/ACT NA SUSP: 1.0000 | Freq: Every day | NASAL | 0 refills | Status: DC

## 2019-07-21 MED ORDER — DEXAMETHASONE 4 MG PO TABS
10.0000 mg | ORAL_TABLET | Freq: Once | ORAL | Status: AC
  Administered 2019-07-21: 10 mg via ORAL
  Filled 2019-07-21: qty 2

## 2019-07-21 NOTE — ED Provider Notes (Signed)
[facility] DEPT Provider Note   CSN: [id number] Arrival date & time: 07/21/19  0857     History   Chief Complaint Chief Complaint  Patient presents with  . Nasal Congestion    HPI Meagan Padilla is a 43 y.o. female.     The history is provided by the patient.  URI Presenting symptoms: congestion and fever   Presenting symptoms: no cough, no ear pain and no sore throat   Fever:    Timing:  Intermittent   Temp source:  Subjective Severity:  Mild Onset quality:  Gradual Duration:  5 days Timing:  Intermittent Progression:  Waxing and waning Chronicity:  New Relieved by:  Nothing Worsened by:  Nothing Associated symptoms: no arthralgias, no headaches, no myalgias, no neck pain, no sinus pain, no sneezing, no swollen glands and no wheezing   Risk factors: no chronic respiratory disease     Past Medical History:  Diagnosis Date  . Hypertension   . Murmur, cardiac     There are no active problems to display for this patient.   Past Surgical History:  Procedure Laterality Date  . DENTAL SURGERY       OB History   No obstetric history on file.      Home Medications    Prior to Admission medications   Medication Sig Start Date End Date Taking? Authorizing Provider  acetaminophen (TYLENOL) 500 MG tablet Take 500 mg by mouth every 6 (six) hours as needed for headache.     [provider]  atenolol-chlorthalidone (TENORETIC) 50-25 MG tablet Take 1 tablet by mouth daily.    [provider]  cyclobenzaprine (FLEXERIL) 10 MG tablet Take 1/2 to 1 whole tablet by mouth every 8 hours as needed for muscle spasms/pain. 09/07/18   Katy Apo, NP  fluticasone (FLONASE) 50 MCG/ACT nasal spray Place 1 spray into both nostrils daily. 07/21/19   Lennice Sites, DO    Family History Family History  Problem Relation Age of Onset  . Hypertension Mother   . Hypertension Father     Social History Social History    Tobacco Use  . Smoking status: Never Smoker  . Smokeless tobacco: Never Used  Substance Use Topics  . Alcohol use: No  . Drug use: No     Allergies   Patient has no known allergies.   Review of Systems Review of Systems  Constitutional: Positive for fever. Negative for chills.  HENT: Positive for congestion. Negative for ear pain, sinus pain, sneezing and sore throat.   Eyes: Negative for pain and visual disturbance.  Respiratory: Negative for cough, shortness of breath and wheezing.   Cardiovascular: Negative for chest pain and palpitations.  Gastrointestinal: Negative for abdominal pain and vomiting.  Genitourinary: Negative for dysuria and hematuria.  Musculoskeletal: Negative for arthralgias, back pain, myalgias and neck pain.  Skin: Negative for color change and rash.  Neurological: Negative for seizures, syncope and headaches.  All other systems reviewed and are negative.    Physical Exam Updated Vital Signs  ED Triage Vitals  Enc Vitals Group     BP 07/21/19 0910 (!) 143/100     Pulse Rate 07/21/19 0910 94     Resp 07/21/19 0910 16     Temp 07/21/19 0910 99.4 F (37.4 C)     Temp Source 07/21/19 0910 Oral     SpO2 07/21/19 0910 99 %     Weight 07/21/19 0911 184 lb 8 oz (83.7 kg)  Height 07/21/19 0911 5\' 2"  (1.575 m)     Head Circumference --      Peak Flow --      Pain Score 07/21/19 0910 0     Pain Loc --      Pain Edu? --      Excl. in [place]? --     Physical Exam Vitals signs and nursing note reviewed.  Constitutional:      General: She is not in acute distress.    Appearance: She is well-developed. She is not ill-appearing.  HENT:     Head: Normocephalic and atraumatic.     Right Ear: Tympanic membrane normal.     Left Ear: Tympanic membrane normal.     Nose: Nose normal.     Mouth/Throat:     Mouth: Mucous membranes are moist.     Pharynx: No oropharyngeal exudate or posterior oropharyngeal erythema.  Eyes:     Extraocular Movements:  Extraocular movements intact.     Conjunctiva/sclera: Conjunctivae normal.     Pupils: Pupils are equal, round, and reactive to light.  Neck:     Musculoskeletal: Normal range of motion and neck supple.  Cardiovascular:     Rate and Rhythm: Normal rate and regular rhythm.     Pulses: Normal pulses.     Heart sounds: Normal heart sounds. No murmur.  Pulmonary:     Effort: Pulmonary effort is normal. No respiratory distress.     Breath sounds: Normal breath sounds.  Abdominal:     General: There is no distension.     Palpations: Abdomen is soft.     Tenderness: There is no abdominal tenderness.  Skin:    General: Skin is warm and dry.  Neurological:     General: No focal deficit present.     Mental Status: She is alert and oriented to person, place, and time.  Psychiatric:        Mood and Affect: Mood normal.      ED Treatments / Results  Labs (all labs ordered are listed, but only abnormal results are displayed) Labs Reviewed  NOVEL CORONAVIRUS, NAA (HOSP ORDER, SEND-OUT TO REF LAB; TAT 18-24 HRS)    EKG None  Radiology No results found.  Procedures Procedures (including critical care time)  Medications Ordered in ED Medications  dexamethasone (DECADRON) tablet 10 mg (has no administration in time range)     Initial Impression / Assessment and Plan / ED Course  I have reviewed the triage vital signs and the nursing notes.  Pertinent labs & imaging results that were available during my care of the patient were reviewed by me and considered in my medical decision making (see chart for details).     Meagan Padilla is a 43 year old female with no significant medical history presents the ED with nasal congestion.  Patient with unremarkable vitals.  Has had fever, nasal congestion for the last several days.  No respiratory symptoms.  Clear breath sounds.  No signs of ear infection.  No signs of throat infection on exam.  Likely viral versus allergic.  Will treat  with Decadron and Flonase.  Will test for coronavirus.  Educated about self isolation at home.  Recommend follow-up with primary care doctor symptoms worsen.  Discharged in good condition.  Meagan Padilla was evaluated in Emergency Department on 07/21/2019 for the symptoms described in the history of present illness. She was evaluated in the context of the global COVID-19 pandemic, which necessitated consideration that the patient  might be at risk for infection with the SARS-CoV-2 virus that causes COVID-19. Institutional protocols and algorithms that pertain to the evaluation of patients at risk for COVID-19 are in a state of rapid change based on information released by regulatory bodies including the CDC and federal and state organizations. These policies and algorithms were followed during the patient's care in the ED.   Final Clinical Impressions(s) / ED Diagnoses   Final diagnoses:  Upper respiratory tract infection, unspecified type    ED Discharge Orders         Ordered    fluticasone (FLONASE) 50 MCG/ACT nasal spray  Daily     07/21/19 [place], [place], DO 07/21/19 1049

## 2019-07-21 NOTE — ED Triage Notes (Signed)
Pt been having post nasal congestion and cold symptoms since last week. reports taking Mucinex.

## 2019-07-22 LAB — NOVEL CORONAVIRUS, NAA (HOSP ORDER, SEND-OUT TO REF LAB; TAT 18-24 HRS): SARS-CoV-2, NAA: DETECTED — AB

## 2019-08-05 ENCOUNTER — Other Ambulatory Visit: Payer: Self-pay

## 2019-08-05 DIAGNOSIS — Z20822 Contact with and (suspected) exposure to covid-19: Secondary | ICD-10-CM

## 2019-08-07 LAB — NOVEL CORONAVIRUS, NAA: SARS-CoV-2, NAA: NOT DETECTED

## 2020-10-04 ENCOUNTER — Encounter: Payer: Self-pay | Admitting: Nurse Practitioner

## 2020-10-04 ENCOUNTER — Other Ambulatory Visit: Payer: Self-pay

## 2020-10-04 ENCOUNTER — Ambulatory Visit: Payer: No Typology Code available for payment source | Attending: Nurse Practitioner | Admitting: Nurse Practitioner

## 2020-10-04 VITALS — Ht 62.0 in | Wt 190.0 lb

## 2020-10-04 DIAGNOSIS — R7989 Other specified abnormal findings of blood chemistry: Secondary | ICD-10-CM | POA: Diagnosis not present

## 2020-10-04 DIAGNOSIS — E669 Obesity, unspecified: Secondary | ICD-10-CM | POA: Diagnosis not present

## 2020-10-04 DIAGNOSIS — Z1159 Encounter for screening for other viral diseases: Secondary | ICD-10-CM | POA: Diagnosis not present

## 2020-10-04 DIAGNOSIS — I10 Essential (primary) hypertension: Secondary | ICD-10-CM | POA: Diagnosis not present

## 2020-10-04 MED ORDER — ATENOLOL-CHLORTHALIDONE 50-25 MG PO TABS: 1.0000 | ORAL_TABLET | Freq: Every day | ORAL | 0 refills | Status: DC

## 2020-10-04 NOTE — Progress Notes (Signed)
Virtual Visit via Telephone Note Due to national recommendations of social distancing due to [place] 19, telehealth visit is felt to be most appropriate for this patient at this time.  I discussed the limitations, risks, security and privacy concerns of performing an evaluation and management service by telephone and the availability of in person appointments. I also discussed with the patient that there may be a patient responsible charge related to this service. The patient expressed understanding and agreed to proceed.    I connected with Meagan Padilla on 10/04/20  at   8:50 AM EST  EDT by telephone and verified that I am speaking with the correct person using two identifiers.   Consent I discussed the limitations, risks, security and privacy concerns of performing an evaluation and management service by telephone and the availability of in person appointments. I also discussed with the patient that there may be a patient responsible charge related to this service. The patient expressed understanding and agreed to proceed.   Location of Patient: Private Residence    Location of Provider: [place] participating in Telemedicine visit: [name] FNP-BC [place]    History of Present Illness: Telemedicine visit for: Follow Up Patient has been counseled on age-appropriate routine health concerns for screening and prevention. These are reviewed and up-to-date. Referrals have been placed accordingly. Immunizations are up-to-date or declined.    Mammogram: Over due. [facility] Pap Smear: Over due (2017). Will schedule   She has a history of HTN and heart murmur.  Essential Hypertension Blood pressure at home 130/70-80. Monitors about 3 times per week. HR 70s. She is currently taking tenoretic 50-25 mg daily. Taking tenoretic. Based on chart review it appears she has a history of HTN and tachycardia.  Denies chest pain, shortness of breath, palpitations, lightheadedness, dizziness, headaches or BLE edema.  BP Readings from Last 3 Encounters:  07/21/19 (!) 148/103  09/14/18 129/77  09/07/18 (!) 143/92      Past Medical History:  Diagnosis Date  . Hypertension   . Murmur, cardiac     Past Surgical History:  Procedure Laterality Date  . DENTAL SURGERY      Family History  Problem Relation Age of Onset  . Hypertension Mother   . Hypertension Father   . Hypertension Brother     Social History   Socioeconomic History  . Marital status: Single    Spouse name: Not on file  . Number of children: Not on file  . Years of education: Not on file  . Highest education level: Not on file  Occupational History  . Not on file  Tobacco Use  . Smoking status: Never Smoker  . Smokeless tobacco: Never Used  Vaping Use  . Vaping Use: Never used  Substance and Sexual Activity  . Alcohol use: No  . Drug use: No  . Sexual activity: Not Currently  Other Topics Concern  . Not on file  Social History Narrative  . Not on file   Social Determinants of Health   Financial Resource Strain: Not on file  Food Insecurity: Not on file  Transportation Needs: Not on file  Physical Activity: Not on file  Stress: Not on file  Social Connections: Not on file     Observations/Objective: Awake, alert and oriented x 3   Review of Systems  Constitutional: Negative for fever, malaise/fatigue and weight loss.  HENT: Negative.  Negative for nosebleeds.  Eyes: Negative.  Negative for blurred vision, double vision and photophobia.  Respiratory: Negative.  Negative for cough and shortness of breath.   Cardiovascular: Positive for leg swelling. Negative for chest pain and palpitations.  Gastrointestinal: Negative.  Negative for heartburn, nausea and vomiting.  Musculoskeletal: Negative.  Negative for myalgias.  Neurological: Negative.  Negative for dizziness, focal weakness, seizures and  headaches.  Endo/Heme/Allergies: Positive for environmental allergies.  Psychiatric/Behavioral: Negative.  Negative for suicidal ideas.    Assessment and Plan: Meagan Padilla was seen today for new patient (initial visit).  Diagnoses and all orders for this visit:  Essential hypertension -     atenolol-chlorthalidone (TENORETIC) 50-25 MG tablet; Take 1 tablet by mouth daily. -     CMP14+EGFR; Future Continue TENORETIC as prescribed.  Remember to bring in your blood pressure log with you for your follow up appointment.  DASH/Mediterranean Diets are healthier choices for HTN.    Need for hepatitis C screening test -     HCV Ab w Reflex to Quant PCR; Future  Obesity (BMI 30-39.9) -     Hemoglobin A1c; Future -     TSH; Future -     Lipid panel; Future  Abnormal CBC -     CBC; Future     Follow Up Instructions Return in about 4 weeks (around 11/01/2020) for HTN.     I discussed the assessment and treatment plan with the patient. The patient was provided an opportunity to ask questions and all were answered. The patient agreed with the plan and demonstrated an understanding of the instructions.   The patient was advised to call back or seek an in-person evaluation if the symptoms worsen or if the condition fails to improve as anticipated.  I provided 15 minutes of non-face-to-face time during this encounter including median intraservice time, reviewing previous notes, labs, imaging, medications and explaining diagnosis and management.  [name], FNP-BC

## 2020-10-05 ENCOUNTER — Ambulatory Visit: Payer: No Typology Code available for payment source | Attending: Nurse Practitioner

## 2020-10-05 ENCOUNTER — Other Ambulatory Visit: Payer: Self-pay

## 2020-10-05 DIAGNOSIS — Z1159 Encounter for screening for other viral diseases: Secondary | ICD-10-CM

## 2020-10-05 DIAGNOSIS — R7989 Other specified abnormal findings of blood chemistry: Secondary | ICD-10-CM

## 2020-10-05 DIAGNOSIS — E669 Obesity, unspecified: Secondary | ICD-10-CM

## 2020-10-05 DIAGNOSIS — I10 Essential (primary) hypertension: Secondary | ICD-10-CM

## 2020-10-06 LAB — CMP14+EGFR
ALT: 9 IU/L (ref 0–32)
AST: 13 IU/L (ref 0–40)
Albumin/Globulin Ratio: 1.1 — ABNORMAL LOW (ref 1.2–2.2)
Albumin: 3.9 g/dL (ref 3.8–4.8)
Alkaline Phosphatase: 102 IU/L (ref 44–121)
BUN/Creatinine Ratio: 9 (ref 9–23)
BUN: 7 mg/dL (ref 6–24)
Bilirubin Total: <0.2 mg/dL (ref 0.0–1.2)
CO2: 24 mmol/L (ref 20–29)
Calcium: 9.5 mg/dL (ref 8.7–10.2)
Chloride: 98 mmol/L (ref 96–106)
Creatinine, Ser: 0.8 mg/dL (ref 0.57–1.00)
GFR calc Af Amer: 104 mL/min/{1.73_m2} (ref >59)
GFR calc non Af Amer: 91 mL/min/{1.73_m2} (ref >59)
Globulin, Total: 3.7 g/dL (ref 1.5–4.5)
Glucose: 93 mg/dL (ref 65–99)
Potassium: 3.6 mmol/L (ref 3.5–5.2)
Sodium: 137 mmol/L (ref 134–144)
Total Protein: 7.6 g/dL (ref 6.0–8.5)

## 2020-10-06 LAB — LIPID PANEL
Chol/HDL Ratio: 2.7 ratio (ref 0.0–4.4)
Cholesterol, Total: 133 mg/dL (ref 100–199)
HDL: 49 mg/dL (ref >39)
LDL Chol Calc (NIH): 72 mg/dL (ref 0–99)
Triglycerides: 54 mg/dL (ref 0–149)
VLDL Cholesterol Cal: 12 mg/dL (ref 5–40)

## 2020-10-06 LAB — HCV INTERPRETATION

## 2020-10-06 LAB — HCV AB W REFLEX TO QUANT PCR: HCV Ab: <0.1 s/co ratio (ref 0.0–0.9)

## 2020-10-06 LAB — CBC
Hematocrit: 35.5 % (ref 34.0–46.6)
Hemoglobin: 11.3 g/dL (ref 11.1–15.9)
MCH: 24.1 pg — ABNORMAL LOW (ref 26.6–33.0)
MCHC: 31.8 g/dL (ref 31.5–35.7)
MCV: 76 fL — ABNORMAL LOW (ref 79–97)
Platelets: 316 10*3/uL (ref 150–450)
RBC: 4.68 x10E6/uL (ref 3.77–5.28)
RDW: 14.9 % (ref 11.7–15.4)
WBC: 7.2 10*3/uL (ref 3.4–10.8)

## 2020-10-06 LAB — HEMOGLOBIN A1C
Est. average glucose Bld gHb Est-mCnc: 120 mg/dL
Hgb A1c MFr Bld: 5.8 % — ABNORMAL HIGH (ref 4.8–5.6)

## 2020-10-06 LAB — TSH: TSH: 1.96 u[IU]/mL (ref 0.450–4.500)

## 2020-11-15 ENCOUNTER — Ambulatory Visit: Payer: No Typology Code available for payment source | Admitting: Nurse Practitioner

## 2020-11-28 ENCOUNTER — Encounter: Payer: Self-pay | Admitting: Nurse Practitioner

## 2020-11-28 ENCOUNTER — Other Ambulatory Visit: Payer: Self-pay

## 2020-11-28 ENCOUNTER — Ambulatory Visit: Payer: No Typology Code available for payment source | Attending: Nurse Practitioner | Admitting: Nurse Practitioner

## 2020-11-28 VITALS — BP 110/71 | HR 75 | Temp 98.8°F | Ht 62.0 in | Wt 191.4 lb

## 2020-11-28 DIAGNOSIS — I10 Essential (primary) hypertension: Secondary | ICD-10-CM

## 2020-11-28 DIAGNOSIS — Z0000 Encounter for general adult medical examination without abnormal findings: Secondary | ICD-10-CM

## 2020-11-28 DIAGNOSIS — I890 Lymphedema, not elsewhere classified: Secondary | ICD-10-CM

## 2020-11-28 DIAGNOSIS — R7303 Prediabetes: Secondary | ICD-10-CM

## 2020-11-28 DIAGNOSIS — Z1231 Encounter for screening mammogram for malignant neoplasm of breast: Secondary | ICD-10-CM

## 2020-11-28 DIAGNOSIS — Z114 Encounter for screening for human immunodeficiency virus [HIV]: Secondary | ICD-10-CM

## 2020-11-28 LAB — GLUCOSE, POCT (MANUAL RESULT ENTRY): POC Glucose: 131 mg/dl — AB (ref 70–99)

## 2020-11-28 MED ORDER — FUROSEMIDE 20 MG PO TABS: 10.0000 mg | ORAL_TABLET | Freq: Every day | ORAL | 0 refills | Status: DC

## 2020-11-28 MED ORDER — MISC. DEVICES MISC: 0 refills | Status: DC

## 2020-11-28 MED ORDER — ATENOLOL-CHLORTHALIDONE 50-25 MG PO TABS: 1.0000 | ORAL_TABLET | Freq: Every day | ORAL | 0 refills | Status: DC

## 2020-11-28 NOTE — Progress Notes (Signed)
Assessment & Plan:  Meagan Padilla was seen today for annual exam.  Diagnoses and all orders for this visit:  Encounter for annual physical exam  Prediabetes -     Glucose (CBG)  Encounter for screening for HIV -     Cancel: HIV antibody (with reflex) -     HIV antibody (with reflex); Future  Essential hypertension -     atenolol-chlorthalidone (TENORETIC) 50-25 MG tablet; Take 1 tablet by mouth daily.  Breast cancer screening by mammogram -     MM 3D SCREEN BREAST BILATERAL; Future  Lymphedema -     furosemide (LASIX) 20 MG tablet; Take 0.5 tablets (10 mg total) by mouth daily. -     Basic metabolic panel; Future -     Misc. Devices MISC; Please provide patient with custom fitted compression stockings I89.0     Patient has been counseled on age-appropriate routine health concerns for screening and prevention. These are reviewed and up-to-date. Referrals have been placed accordingly. Immunizations are up-to-date or declined.    Subjective:   Chief Complaint  Patient presents with  . Annual Exam    Patient is here for physical.   HPI Meagan Padilla 45 y.o. female presents to office today for annual physical.   Edema She has bilateral LE edema. The edema has been moderate and severe.  Onset of symptoms was several years ago, unchanged since that time. The edema is present all day. The patient states the problem is long-standing.  The swelling has been aggravated by dependency of involved area, relieved by nothing, and been associated with nothing. Cardiac risk factors include hypertension and obesity (BMI >= 30 kg/m2). She has not been consistently wearing her compression socks. I have recommended she wear these daily. Her symptoms appear to be caused from lymphedema. Will try furosemide in addition to her current atenolol-chlorthalidone 50-25 mg daily that she is currently taking.    Review of Systems  Constitutional: Negative for fever, malaise/fatigue and weight loss.   HENT: Negative.  Negative for nosebleeds.   Eyes: Negative.  Negative for blurred vision, double vision and photophobia.  Respiratory: Negative.  Negative for cough and shortness of breath.   Cardiovascular: Positive for leg swelling. Negative for chest pain and palpitations.  Gastrointestinal: Negative.  Negative for heartburn, nausea and vomiting.  Genitourinary: Negative.   Musculoskeletal: Negative.  Negative for myalgias.  Skin: Negative.   Neurological: Negative.  Negative for dizziness, focal weakness, seizures and headaches.  Endo/Heme/Allergies: Negative.   Psychiatric/Behavioral: Negative.  Negative for suicidal ideas.    Past Medical History:  Diagnosis Date  . Hypertension   . Murmur, cardiac     Past Surgical History:  Procedure Laterality Date  . DENTAL SURGERY      Family History  Problem Relation Age of Onset  . Hypertension Mother   . Hypertension Father   . Hypertension Brother     Social History Reviewed with no changes to be made today.   Outpatient Medications Prior to Visit  Medication Sig Dispense Refill  . acetaminophen (TYLENOL) 500 MG tablet Take 500 mg by mouth every 6 (six) hours as needed for headache.     . fluticasone (FLONASE) 50 MCG/ACT nasal spray Place 1 spray into both nostrils daily. 16 g 0  . atenolol-chlorthalidone (TENORETIC) 50-25 MG tablet Take 1 tablet by mouth daily. 90 tablet 0   No facility-administered medications prior to visit.    No Known Allergies     Objective:  BP 110/71 (BP Location: Left Arm, Patient Position: Sitting, Cuff Size: Large)   Pulse 75   Temp 98.8 F (37.1 C) (Oral)   Ht 5\' 2"  (1.575 m)   Wt 191 lb 6.4 oz (86.8 kg)   LMP 11/28/2020   SpO2 98%   BMI 35.01 kg/m  Wt Readings from Last 3 Encounters:  11/28/20 191 lb 6.4 oz (86.8 kg)  10/04/20 190 lb (86.2 kg)  07/21/19 184 lb 8 oz (83.7 kg)    Physical Exam Constitutional:      Appearance: She is well-developed and well-nourished.   HENT:     Head: Normocephalic and atraumatic.     Right Ear: External ear normal.     Left Ear: External ear normal.     Nose: Nose normal.     Mouth/Throat:     Mouth: Oropharynx is clear and moist.     Pharynx: No oropharyngeal exudate.  Eyes:     General: No scleral icterus.       Right eye: No discharge.     Extraocular Movements: EOM normal.     Conjunctiva/sclera: Conjunctivae normal.     Pupils: Pupils are equal, round, and reactive to light.  Neck:     Thyroid: No thyromegaly.     Trachea: No tracheal deviation.  Cardiovascular:     Rate and Rhythm: Normal rate and regular rhythm.     Pulses: Intact distal pulses.     Heart sounds: Normal heart sounds. No murmur heard. No friction rub.  Pulmonary:     Effort: Pulmonary effort is normal. No accessory muscle usage or respiratory distress.     Breath sounds: Normal breath sounds. No decreased breath sounds, wheezing, rhonchi or rales.  Chest:     Chest wall: No tenderness.  Breasts: Breasts are symmetrical.     Right: No inverted nipple, mass, nipple discharge, skin change or tenderness.     Left: No inverted nipple, mass, nipple discharge, skin change or tenderness.    Abdominal:     General: Bowel sounds are normal. There is no distension.     Palpations: Abdomen is soft. There is no mass.     Tenderness: There is no abdominal tenderness. There is no guarding or rebound.  Musculoskeletal:        General: Swelling present. No tenderness, deformity or signs of injury. Normal range of motion.     Cervical back: Normal range of motion and neck supple.     Right lower leg: Edema present.     Left lower leg: Edema present.  Feet:     Right foot:     Skin integrity: Dry skin present. No skin breakdown, erythema or warmth.     Left foot:     Skin integrity: Dry skin present. No skin breakdown, erythema or warmth.  Lymphadenopathy:     Cervical: No cervical adenopathy.  Skin:    General: Skin is warm and dry.      Findings: No erythema.  Neurological:     Mental Status: She is alert and oriented to person, place, and time.     Cranial Nerves: Cranial nerves are intact. No cranial nerve deficit.     Sensory: Sensation is intact.     Motor: Motor function is intact.     Coordination: Coordination is intact. Coordination normal.     Gait: Gait is intact.     Deep Tendon Reflexes: Reflexes are normal and symmetric.     Reflex Scores:  Patellar reflexes are 2+ on the right side and 2+ on the left side. Psychiatric:        Mood and Affect: Mood and affect normal.        Speech: Speech normal.        Behavior: Behavior normal.        Thought Content: Thought content normal.        Judgment: Judgment normal.          Patient has been counseled extensively about nutrition and exercise as well as the importance of adherence with medications and regular follow-up. The patient was given clear instructions to go to ER or return to medical center if symptoms don't improve, worsen or new problems develop. The patient verbalized understanding.   Follow-up: Return in about 2 weeks (around 12/12/2020) for LABS in 2 weeks see me in 3 months.   [name], FNP-BC [facility], [place]   11/28/2020, 2:16 PM

## 2020-12-08 ENCOUNTER — Encounter (HOSPITAL_COMMUNITY): Payer: Self-pay

## 2020-12-08 ENCOUNTER — Ambulatory Visit (HOSPITAL_COMMUNITY)
Admission: EM | Admit: 2020-12-08 | Discharge: 2020-12-08 | Disposition: A | Discharge status: Home / Self Care | Payer: No Typology Code available for payment source | Attending: Internal Medicine | Admitting: Internal Medicine

## 2020-12-08 DIAGNOSIS — Z20822 Contact with and (suspected) exposure to covid-19: Secondary | ICD-10-CM | POA: Diagnosis not present

## 2020-12-08 DIAGNOSIS — R112 Nausea with vomiting, unspecified: Secondary | ICD-10-CM | POA: Diagnosis present

## 2020-12-08 DIAGNOSIS — R197 Diarrhea, unspecified: Secondary | ICD-10-CM

## 2020-12-08 LAB — SARS CORONAVIRUS 2 (TAT 6-24 HRS): SARS Coronavirus 2: NEGATIVE

## 2020-12-08 MED ORDER — ONDANSETRON 4 MG PO TBDP
4.0000 mg | ORAL_TABLET | Freq: Once | ORAL | Status: AC
  Administered 2020-12-08: 4 mg via ORAL

## 2020-12-08 MED ORDER — DICYCLOMINE HCL 20 MG PO TABS: 20.0000 mg | ORAL_TABLET | Freq: Two times a day (BID) | ORAL | 0 refills | Status: DC | PRN

## 2020-12-08 MED ORDER — ONDANSETRON 4 MG PO TBDP
ORAL_TABLET | ORAL | Status: AC
  Filled 2020-12-08: qty 1

## 2020-12-08 MED ORDER — ONDANSETRON 4 MG PO TBDP: 4.0000 mg | ORAL_TABLET | Freq: Three times a day (TID) | ORAL | 0 refills | Status: DC | PRN

## 2020-12-08 NOTE — ED Provider Notes (Signed)
[name]    CSN: [id number] Arrival date & time: 12/08/20  0857      History   Chief Complaint Chief Complaint  Patient presents with  . Emesis    HPI Meagan Padilla is a 45 y.o. female.   45 year old female comes in for 2-day history of nausea, vomiting, diarrhea.  Has had 5 episodes of nonbilious nonbloody vomiting.  2 episodes of watery diarrhea, denies melena, hematochezia.  States prior to diarrhea onset, had generalized abdominal pain described as "churning", this has resolved since diarrhea onset.  Denies fever, chills, body aches.  Denies URI symptoms such as cough, congestion, sore throat.  Denies urinary changes.  Denies travel.  Positive sick contact.  Was able to tolerate small fluid intake since last episode of vomiting.     Past Medical History:  Diagnosis Date  . Hypertension   . Murmur, cardiac     There are no problems to display for this patient.   Past Surgical History:  Procedure Laterality Date  . DENTAL SURGERY      OB History   No obstetric history on file.      Home Medications    Prior to Admission medications   Medication Sig Start Date End Date Taking? Authorizing Provider  atenolol-chlorthalidone (TENORETIC) 50-25 MG tablet Take 1 tablet by mouth daily. 11/28/20 02/26/21 Yes [name], NP  dicyclomine (BENTYL) 20 MG tablet Take 1 tablet (20 mg total) by mouth 2 (two) times daily as needed for spasms. 12/08/20  Yes Rihanna Marseille V, PA-C  ondansetron (ZOFRAN ODT) 4 MG disintegrating tablet Take 1 tablet (4 mg total) by mouth every 8 (eight) hours as needed for nausea or vomiting. 12/08/20  Yes Deneene Tarver V, PA-C  acetaminophen (TYLENOL) 500 MG tablet Take 500 mg by mouth every 6 (six) hours as needed for headache.     [provider]  fluticasone (FLONASE) 50 MCG/ACT nasal spray Place 1 spray into both nostrils daily. 07/21/19   Curatolo, Adam, DO  furosemide (LASIX) 20 MG tablet Take 0.5 tablets (10 mg total) by mouth  daily. 11/28/20 12/28/20  [name], NP  Misc. Devices MISC Please provide patient with custom fitted compression stockings I89.0 11/28/20   [name], NP    Family History Family History  Problem Relation Age of Onset  . Hypertension Mother   . Hypertension Father   . Hypertension Brother     Social History Social History   Tobacco Use  . Smoking status: Never Smoker  . Smokeless tobacco: Never Used  Vaping Use  . Vaping Use: Never used  Substance Use Topics  . Alcohol use: No  . Drug use: No     Allergies   Patient has no known allergies.   Review of Systems Review of Systems  Reason unable to perform ROS: See HPI as above.     Physical Exam Triage Vital Signs ED Triage Vitals  Enc Vitals Group     BP 12/08/20 0940 120/79     Pulse Rate 12/08/20 0940 (!) 106     Resp 12/08/20 0940 20     Temp 12/08/20 0940 99.5 F (37.5 C)     Temp Source 12/08/20 0940 Oral     SpO2 12/08/20 0940 100 %     Weight 12/08/20 0937 191 lb (86.6 kg)     Height 12/08/20 0937 5\' 2"  (1.575 m)     Head Circumference --      Peak Flow --  Pain Score 12/08/20 0936 0     Pain Loc --      Pain Edu? --      Excl. in [place]? --    No data found.  Updated Vital Signs BP 120/79 (BP Location: Left Arm)   Pulse (!) 106   Temp 99.5 F (37.5 C) (Oral)   Resp 20   Ht 5\' 2"  (1.575 m)   Wt 191 lb (86.6 kg)   LMP 11/28/2020   SpO2 100%   BMI 34.93 kg/m   Physical Exam Constitutional:      General: She is not in acute distress.    Appearance: She is well-developed. She is not ill-appearing, toxic-appearing or diaphoretic.  HENT:     Head: Normocephalic and atraumatic.  Eyes:     Conjunctiva/sclera: Conjunctivae normal.     Pupils: Pupils are equal, round, and reactive to light.  Cardiovascular:     Rate and Rhythm: Normal rate and regular rhythm.     Comments: Tachycardia resolved on exam. Pulmonary:     Effort: Pulmonary effort is normal. No respiratory distress.      Comments: LCTAB Abdominal:     General: Bowel sounds are normal.     Palpations: Abdomen is soft.     Tenderness: There is no abdominal tenderness. There is no right CVA tenderness, left CVA tenderness, guarding or rebound.  Musculoskeletal:     Cervical back: Normal range of motion and neck supple.  Skin:    General: Skin is warm and dry.  Neurological:     Mental Status: She is alert and oriented to person, place, and time.  Psychiatric:        Behavior: Behavior normal.        Judgment: Judgment normal.      UC Treatments / Results  Labs (all labs ordered are listed, but only abnormal results are displayed) Labs Reviewed  SARS CORONAVIRUS 2 (TAT 6-24 HRS)    EKG   Radiology No results found.  Procedures Procedures (including critical care time)  Medications Ordered in UC Medications  ondansetron (ZOFRAN-ODT) disintegrating tablet 4 mg (4 mg Oral Given 12/08/20 0944)    Initial Impression / Assessment and Plan / UC Course  I have reviewed the triage vital signs and the nursing notes.  Pertinent labs & imaging results that were available during my care of the patient were reviewed by me and considered in my medical decision making (see chart for details).    Patient with resolved nausea after Zofran.  Was able to tolerate fluid intake at home after last vomiting episode.  Covid testing ordered.  Zofran and Bentyl for symptomatic management.  Push fluids.  Return precautions given.  Patient expresses understanding and agrees to plan.  Final Clinical Impressions(s) / UC Diagnoses   Final diagnoses:  Nausea vomiting and diarrhea    ED Prescriptions    Medication Sig Dispense Auth. Provider   ondansetron (ZOFRAN ODT) 4 MG disintegrating tablet Take 1 tablet (4 mg total) by mouth every 8 (eight) hours as needed for nausea or vomiting. 20 tablet Kendre Sires V, PA-C   dicyclomine (BENTYL) 20 MG tablet Take 1 tablet (20 mg total) by mouth 2 (two) times daily as needed  for spasms. 20 tablet Ok Edwards, PA-C     PDMP not reviewed this encounter.   Ok Edwards, PA-C 12/08/20 1044

## 2020-12-08 NOTE — ED Triage Notes (Addendum)
Pt c/o acute n/v/d onset yesterday morning. Last emesis at 0400.   Pt states her brother had similar symptoms last week and was evaluated here and told he "had a virus".  Denies fever, dysuria symptoms, URI symptoms, ear pain, sore throat, abdominal pain.  Tolerated crushed ice and ginger ale this morning.  Has not taken any meds for symptoms.  Has not taken HTN meds or new Lasix Rx today.

## 2020-12-08 NOTE — Discharge Instructions (Addendum)
COVID testing pending. Zofran for nausea and vomiting as needed. Bentyl for abdominal cramping. Keep hydrated, you urine should be clear to pale yellow in color. Bland diet, advance as tolerated. Monitor for any worsening of symptoms, nausea or vomiting not controlled by medication, worsening abdominal pain, fever, go to the emergency department for further evaluation needed.

## 2020-12-12 ENCOUNTER — Other Ambulatory Visit: Payer: Self-pay

## 2020-12-12 ENCOUNTER — Ambulatory Visit: Payer: No Typology Code available for payment source | Attending: Nurse Practitioner

## 2020-12-12 DIAGNOSIS — I890 Lymphedema, not elsewhere classified: Secondary | ICD-10-CM

## 2020-12-12 DIAGNOSIS — Z114 Encounter for screening for human immunodeficiency virus [HIV]: Secondary | ICD-10-CM

## 2020-12-13 LAB — BASIC METABOLIC PANEL
BUN/Creatinine Ratio: 5 — ABNORMAL LOW (ref 9–23)
BUN: 4 mg/dL — ABNORMAL LOW (ref 6–24)
CO2: 25 mmol/L (ref 20–29)
Calcium: 9.8 mg/dL (ref 8.7–10.2)
Chloride: 98 mmol/L (ref 96–106)
Creatinine, Ser: 0.78 mg/dL (ref 0.57–1.00)
GFR calc Af Amer: 108 mL/min/{1.73_m2} (ref >59)
GFR calc non Af Amer: 93 mL/min/{1.73_m2} (ref >59)
Glucose: 91 mg/dL (ref 65–99)
Potassium: 3.6 mmol/L (ref 3.5–5.2)
Sodium: 141 mmol/L (ref 134–144)

## 2020-12-13 LAB — HIV ANTIBODY (ROUTINE TESTING W REFLEX): HIV Screen 4th Generation wRfx: NONREACTIVE

## 2020-12-27 ENCOUNTER — Other Ambulatory Visit: Payer: Self-pay | Admitting: Nurse Practitioner

## 2020-12-27 DIAGNOSIS — I890 Lymphedema, not elsewhere classified: Secondary | ICD-10-CM

## 2020-12-27 NOTE — Telephone Encounter (Signed)
Requested medication (s) are due for refill today: yes  Requested medication (s) are on the active medication list: yes  Last refill: 11/28/20  Future visit scheduled: yes  Notes to clinic:  please verify dose; pt has 10 mg and 20 mg listed    Requested Prescriptions  Pending Prescriptions Disp Refills   furosemide (LASIX) 20 MG tablet [Pharmacy Med Name: FUROSEMIDE 20 MG TABLET] 30 tablet 0    Sig: TAKE 1/2 TABLET BY MOUTH DAILY      Cardiovascular:  Diuretics - Loop Passed - 12/27/2020 11:27 AM      Passed - K in normal range and within 360 days    Potassium  Date Value Ref Range Status  12/12/2020 3.6 3.5 - 5.2 mmol/L Final          Passed - Ca in normal range and within 360 days    Calcium  Date Value Ref Range Status  12/12/2020 9.8 8.7 - 10.2 mg/dL Final          Passed - Na in normal range and within 360 days    Sodium  Date Value Ref Range Status  12/12/2020 141 134 - 144 mmol/L Final          Passed - Cr in normal range and within 360 days    Creatinine, Ser  Date Value Ref Range Status  12/12/2020 0.78 0.57 - 1.00 mg/dL Final    Comment:                   **Effective December 19, 2020 [facility] will begin**                  reporting the 2021 CKD-EPI creatinine equation that                  estimates kidney function without a race variable.           Passed - Last BP in normal range    BP Readings from Last 1 Encounters:  12/08/20 120/79          Passed - Valid encounter within last 6 months    Recent Outpatient Visits           4 weeks ago Encounter for annual physical exam   [place], Vernia Buff, NP   2 months ago Essential hypertension   [place], Zelda W, NP       Future Appointments             In 2 months [name], NP [place]

## 2021-02-16 ENCOUNTER — Encounter: Payer: Self-pay | Admitting: Nurse Practitioner

## 2021-02-16 ENCOUNTER — Telehealth: Payer: Self-pay | Admitting: Nurse Practitioner

## 2021-02-16 NOTE — Telephone Encounter (Signed)
Will forward to provider  

## 2021-02-16 NOTE — Telephone Encounter (Signed)
Copied from [place] [id number]. Topic: General - Other >> Feb 16, 2021 10:14 AM Meagan Padilla, Helene Kelp D wrote: Reason for CRM: Patient called and would like to speak with [name] or her nurse regarding her fluid pill that she gave her. And she went to urgent care and that provider told her not to take it due to her bp was great. Patient is concern and confuse is she should or shouldn't take it. Please call patient back, thanks.

## 2021-02-16 NOTE — Telephone Encounter (Signed)
Message sent via my chart

## 2021-02-28 ENCOUNTER — Ambulatory Visit: Payer: No Typology Code available for payment source | Admitting: Nurse Practitioner

## 2021-03-01 ENCOUNTER — Other Ambulatory Visit: Payer: Self-pay

## 2021-03-01 ENCOUNTER — Ambulatory Visit: Payer: 59 | Attending: Nurse Practitioner | Admitting: Nurse Practitioner

## 2021-03-01 ENCOUNTER — Encounter: Payer: Self-pay | Admitting: Nurse Practitioner

## 2021-03-01 DIAGNOSIS — I10 Essential (primary) hypertension: Secondary | ICD-10-CM

## 2021-03-01 DIAGNOSIS — R600 Localized edema: Secondary | ICD-10-CM | POA: Diagnosis not present

## 2021-03-01 MED ORDER — ATENOLOL-CHLORTHALIDONE 50-25 MG PO TABS: 1.0000 | ORAL_TABLET | Freq: Every day | ORAL | 0 refills | Status: DC

## 2021-03-01 NOTE — Progress Notes (Signed)
Virtual Visit via Telephone Note Due to national recommendations of social distancing due to [place] 19, telehealth visit is felt to be most appropriate for this patient at this time.  I discussed the limitations, risks, security and privacy concerns of performing an evaluation and management service by telephone and the availability of in person appointments. I also discussed with the patient that there may be a patient responsible charge related to this service. The patient expressed understanding and agreed to proceed.    I connected with Meagan Padilla on 03/01/21  at  10:10 AM EDT  EDT by telephone and verified that I am speaking with the correct person using two identifiers.   Consent I discussed the limitations, risks, security and privacy concerns of performing an evaluation and management service by telephone and the availability of in person appointments. I also discussed with the patient that there may be a patient responsible charge related to this service. The patient expressed understanding and agreed to proceed.   Location of Patient: Private Residence    Location of Provider: [place] and [place] participating in Telemedicine visit: [name] FNP-BC [place]    History of Present Illness: Telemedicine visit for: Follow up She has a past medical history of Hypertension and Murmur, cardiac.  Patient has been counseled on age-appropriate routine health concerns for screening and prevention. These are reviewed and up-to-date. Referrals have been placed accordingly. Immunizations are up-to-date or declined.    MAMMOGRAM: I placed a referral for a mammogram in February however patient states she has not heard from the breast clinic.  I did send her a MyChart message with the breast clinic phone number and address for scheduling.    Essential Hypertension Well-controlled with Tenoretic 50-25 mg daily. Denies chest  pain, shortness of breath, palpitations, lightheadedness, dizziness, headaches.  Taking Lasix 10 mg daily for bilateral lower extremity edema associated with prolonged standing and dietary nonadherence.  I encouraged her to drink at least 80 ounces of water daily.  She is working on losing 10 pounds prior to her next office visit. BP Readings from Last 3 Encounters:  12/08/20 120/79  11/28/20 110/71  07/21/19 (!) 148/103      Past Medical History:  Diagnosis Date  . Hypertension   . Murmur, cardiac     Past Surgical History:  Procedure Laterality Date  . DENTAL SURGERY      Family History  Problem Relation Age of Onset  . Hypertension Mother   . Hypertension Father   . Hypertension Brother     Social History   Socioeconomic History  . Marital status: Single    Spouse name: Not on file  . Number of children: Not on file  . Years of education: Not on file  . Highest education level: Not on file  Occupational History  . Not on file  Tobacco Use  . Smoking status: Never Smoker  . Smokeless tobacco: Never Used  Vaping Use  . Vaping Use: Never used  Substance and Sexual Activity  . Alcohol use: No  . Drug use: No  . Sexual activity: Not Currently  Other Topics Concern  . Not on file  Social History Narrative  . Not on file   Social Determinants of Health   Financial Resource Strain: Not on file  Food Insecurity: Not on file  Transportation Needs: Not on file  Physical Activity: Not on file  Stress: Not on file  Social  Connections: Not on file     Observations/Objective: Awake, alert and oriented x 3   Review of Systems  Constitutional: Negative for fever, malaise/fatigue and weight loss.  HENT: Negative.  Negative for nosebleeds.   Eyes: Negative.  Negative for blurred vision, double vision and photophobia.  Respiratory: Negative.  Negative for cough and shortness of breath.   Cardiovascular: Positive for leg swelling. Negative for chest pain and  palpitations.  Gastrointestinal: Negative.  Negative for heartburn, nausea and vomiting.  Musculoskeletal: Negative.  Negative for myalgias.  Neurological: Negative.  Negative for dizziness, focal weakness, seizures and headaches.  Psychiatric/Behavioral: Negative.  Negative for suicidal ideas.    Assessment and Plan: Diagnoses and all orders for this visit:  Essential hypertension -     atenolol-chlorthalidone (TENORETIC) 50-25 MG tablet; Take 1 tablet by mouth daily.  Edema of both lower extremities Dash diet Avoid frozen or processed foods. Increase water intake   Follow Up Instructions Return in about 6 weeks (around 04/10/2021).     I discussed the assessment and treatment plan with the patient. The patient was provided an opportunity to ask questions and all were answered. The patient agreed with the plan and demonstrated an understanding of the instructions.   The patient was advised to call back or seek an in-person evaluation if the symptoms worsen or if the condition fails to improve as anticipated.  I provided 15 minutes of non-face-to-face time during this encounter including median intraservice time, reviewing previous notes, labs, imaging, medications and explaining diagnosis and management.  [name], FNP-BC

## 2021-03-02 ENCOUNTER — Other Ambulatory Visit: Payer: Self-pay

## 2021-03-02 ENCOUNTER — Ambulatory Visit: Payer: 59 | Attending: Nurse Practitioner

## 2021-03-02 ENCOUNTER — Other Ambulatory Visit: Payer: Self-pay | Admitting: Nurse Practitioner

## 2021-03-02 ENCOUNTER — Other Ambulatory Visit: Payer: Self-pay | Admitting: Family Medicine

## 2021-03-02 DIAGNOSIS — I890 Lymphedema, not elsewhere classified: Secondary | ICD-10-CM

## 2021-03-02 DIAGNOSIS — R7989 Other specified abnormal findings of blood chemistry: Secondary | ICD-10-CM

## 2021-03-02 DIAGNOSIS — R7303 Prediabetes: Secondary | ICD-10-CM

## 2021-03-02 DIAGNOSIS — I10 Essential (primary) hypertension: Secondary | ICD-10-CM

## 2021-03-02 DIAGNOSIS — E785 Hyperlipidemia, unspecified: Secondary | ICD-10-CM

## 2021-03-02 NOTE — Telephone Encounter (Signed)
Requested Prescriptions  Pending Prescriptions Disp Refills  . furosemide (LASIX) 20 MG tablet [Pharmacy Med Name: FUROSEMIDE 20 MG TABLET] 30 tablet 3    Sig: TAKE 1/2 TABLET BY MOUTH EVERY DAY     Cardiovascular:  Diuretics - Loop Passed - 03/02/2021  2:10 AM      Passed - K in normal range and within 360 days    Potassium  Date Value Ref Range Status  12/12/2020 3.6 3.5 - 5.2 mmol/L Final         Passed - Ca in normal range and within 360 days    Calcium  Date Value Ref Range Status  12/12/2020 9.8 8.7 - 10.2 mg/dL Final         Passed - Na in normal range and within 360 days    Sodium  Date Value Ref Range Status  12/12/2020 141 134 - 144 mmol/L Final         Passed - Cr in normal range and within 360 days    Creatinine, Ser  Date Value Ref Range Status  12/12/2020 0.78 0.57 - 1.00 mg/dL Final    Comment:                   **Effective December 19, 2020 [facility] will begin**                  reporting the 2021 CKD-EPI creatinine equation that                  estimates kidney function without a race variable.          Passed - Last BP in normal range    BP Readings from Last 1 Encounters:  12/08/20 120/79         Passed - Valid encounter within last 6 months    Recent Outpatient Visits          Yesterday Essential hypertension   [place], Vernia Buff, NP   3 months ago Encounter for annual physical exam   [place], Vernia Buff, NP   4 months ago Essential hypertension   [place], Vernia Buff, NP

## 2021-03-03 LAB — CMP14+EGFR
ALT: 11 IU/L (ref 0–32)
AST: 16 IU/L (ref 0–40)
Albumin/Globulin Ratio: 1.3 (ref 1.2–2.2)
Albumin: 4.2 g/dL (ref 3.8–4.8)
Alkaline Phosphatase: 99 IU/L (ref 44–121)
BUN/Creatinine Ratio: 11 (ref 9–23)
BUN: 9 mg/dL (ref 6–24)
Bilirubin Total: <0.2 mg/dL (ref 0.0–1.2)
CO2: 26 mmol/L (ref 20–29)
Calcium: 9.9 mg/dL (ref 8.7–10.2)
Chloride: 96 mmol/L (ref 96–106)
Creatinine, Ser: 0.82 mg/dL (ref 0.57–1.00)
Globulin, Total: 3.2 g/dL (ref 1.5–4.5)
Glucose: 88 mg/dL (ref 65–99)
Potassium: 3.4 mmol/L — ABNORMAL LOW (ref 3.5–5.2)
Sodium: 138 mmol/L (ref 134–144)
Total Protein: 7.4 g/dL (ref 6.0–8.5)
eGFR: 91 mL/min/{1.73_m2} (ref >59)

## 2021-03-03 LAB — CBC
Hematocrit: 35.4 % (ref 34.0–46.6)
Hemoglobin: 11 g/dL — ABNORMAL LOW (ref 11.1–15.9)
MCH: 23.7 pg — ABNORMAL LOW (ref 26.6–33.0)
MCHC: 31.1 g/dL — ABNORMAL LOW (ref 31.5–35.7)
MCV: 76 fL — ABNORMAL LOW (ref 79–97)
Platelets: 297 10*3/uL (ref 150–450)
RBC: 4.65 x10E6/uL (ref 3.77–5.28)
RDW: 15.5 % — ABNORMAL HIGH (ref 11.7–15.4)
WBC: 6.4 10*3/uL (ref 3.4–10.8)

## 2021-03-03 LAB — LIPID PANEL
Chol/HDL Ratio: 2.7 ratio (ref 0.0–4.4)
Cholesterol, Total: 138 mg/dL (ref 100–199)
HDL: 52 mg/dL (ref >39)
LDL Chol Calc (NIH): 73 mg/dL (ref 0–99)
Triglycerides: 63 mg/dL (ref 0–149)
VLDL Cholesterol Cal: 13 mg/dL (ref 5–40)

## 2021-03-03 LAB — HEMOGLOBIN A1C
Est. average glucose Bld gHb Est-mCnc: 123 mg/dL
Hgb A1c MFr Bld: 5.9 % — ABNORMAL HIGH (ref 4.8–5.6)

## 2021-03-06 ENCOUNTER — Other Ambulatory Visit: Payer: Self-pay | Admitting: Nurse Practitioner

## 2021-03-06 DIAGNOSIS — E876 Hypokalemia: Secondary | ICD-10-CM

## 2021-04-03 ENCOUNTER — Telehealth: Payer: Self-pay | Admitting: Nurse Practitioner

## 2021-04-03 NOTE — Telephone Encounter (Signed)
Pt called to confirm when her appt was/ Pt also mentioned that she had a small cold that started with a sore throat that mead her hoarse and then coughing up a bit of phlegm this morning/ pt wanted to ask Zelda for advise on what she can take for this cold/ please advise

## 2021-04-06 NOTE — Telephone Encounter (Signed)
Called pt but did not answer so I LVM to inform of upcoming appt and asked if pt could call back w/ the name of the medication.

## 2021-05-05 ENCOUNTER — Other Ambulatory Visit: Payer: Self-pay

## 2021-05-05 ENCOUNTER — Ambulatory Visit: Payer: 59 | Attending: Nurse Practitioner | Admitting: Nurse Practitioner

## 2021-05-05 ENCOUNTER — Encounter: Payer: Self-pay | Admitting: Nurse Practitioner

## 2021-05-05 VITALS — BP 128/78 | HR 76 | Ht 62.0 in | Wt 178.0 lb

## 2021-05-05 DIAGNOSIS — I10 Essential (primary) hypertension: Secondary | ICD-10-CM | POA: Diagnosis not present

## 2021-05-05 DIAGNOSIS — R600 Localized edema: Secondary | ICD-10-CM | POA: Diagnosis not present

## 2021-05-05 DIAGNOSIS — E876 Hypokalemia: Secondary | ICD-10-CM

## 2021-05-05 MED ORDER — MISC. DEVICES MISC: 0 refills | Status: DC

## 2021-05-05 NOTE — Progress Notes (Signed)
Assessment & Plan:  Meagan Padilla was seen today for hypertension.  Diagnoses and all orders for this visit:  Essential hypertension Continue all antihypertensives as prescribed.  Remember to bring in your blood pressure log with you for your follow up appointment.  DASH/Mediterranean Diets are healthier choices for HTN.    Lymphedema -     Misc. Devices MISC; Please provide patient with custom fitted compression stockings I89.0 Continue furosemide as needed BNP pending  Patient has been counseled on age-appropriate routine health concerns for screening and prevention. These are reviewed and up-to-date. Referrals have been placed accordingly. Immunizations are up-to-date or declined.    Subjective:   Chief Complaint  Patient presents with   Hypertension   Hypertension Pertinent negatives include no blurred vision, chest pain, headaches, malaise/fatigue, orthopnea, palpitations, PND or shortness of breath.  Meagan Padilla 45 y.o. female presents to office today for HTN  has a past medical history of Hypertension and Murmur, cardiac.   Essential Hypertension Taking tenoretic 50-25 mg daily and furosemide 10 mg every other day for BLE edema. Need to recheck potassium today due to low K.  Denies chest pain, shortness of breath, palpitations, lightheadedness, dizziness, headaches   BP Readings from Last 3 Encounters:  05/05/21 128/78  12/08/20 120/79  11/28/20 110/71     Review of Systems  Constitutional:  Negative for fever, malaise/fatigue and weight loss.  HENT: Negative.  Negative for nosebleeds.   Eyes: Negative.  Negative for blurred vision, double vision and photophobia.  Respiratory: Negative.  Negative for cough, shortness of breath and wheezing.   Cardiovascular:  Positive for leg swelling. Negative for chest pain, palpitations, orthopnea, claudication and PND.  Gastrointestinal: Negative.  Negative for heartburn, nausea and vomiting.  Musculoskeletal: Negative.  Negative  for myalgias.  Neurological: Negative.  Negative for dizziness, focal weakness, seizures and headaches.  Psychiatric/Behavioral: Negative.  Negative for suicidal ideas.    Past Medical History:  Diagnosis Date   Hypertension    Murmur, cardiac     Past Surgical History:  Procedure Laterality Date   DENTAL SURGERY      Family History  Problem Relation Age of Onset   Hypertension Mother    Hypertension Father    Hypertension Brother     Social History Reviewed with no changes to be made today.   Outpatient Medications Prior to Visit  Medication Sig Dispense Refill   acetaminophen (TYLENOL) 500 MG tablet Take 500 mg by mouth every 6 (six) hours as needed for headache.      atenolol-chlorthalidone (TENORETIC) 50-25 MG tablet Take 1 tablet by mouth daily. 90 tablet 0   dicyclomine (BENTYL) 20 MG tablet Take 1 tablet (20 mg total) by mouth 2 (two) times daily as needed for spasms. 20 tablet 0   fluticasone (FLONASE) 50 MCG/ACT nasal spray Place 1 spray into both nostrils daily. 16 g 0   furosemide (LASIX) 20 MG tablet TAKE 1/2 TABLET BY MOUTH EVERY DAY 30 tablet 3   ondansetron (ZOFRAN ODT) 4 MG disintegrating tablet Take 1 tablet (4 mg total) by mouth every 8 (eight) hours as needed for nausea or vomiting. 20 tablet 0   Misc. Devices MISC Please provide patient with custom fitted compression stockings I89.0 1 each 0   No facility-administered medications prior to visit.    No Known Allergies     Objective:    BP 128/78   Pulse 76   Ht 5\' 2"  (1.575 m)   Wt 178 lb (80.7 kg)  SpO2 100%   BMI 32.56 kg/m  Wt Readings from Last 3 Encounters:  05/05/21 178 lb (80.7 kg)  12/08/20 191 lb (86.6 kg)  11/28/20 191 lb 6.4 oz (86.8 kg)    Physical Exam Vitals and nursing note reviewed.  Constitutional:      Appearance: She is well-developed.  HENT:     Head: Normocephalic and atraumatic.  Cardiovascular:     Rate and Rhythm: Normal rate and regular rhythm.     Heart  sounds: Normal heart sounds. No murmur heard.   No friction rub. No gallop.  Pulmonary:     Effort: Pulmonary effort is normal. No tachypnea or respiratory distress.     Breath sounds: Normal breath sounds. No decreased breath sounds, wheezing, rhonchi or rales.  Chest:     Chest wall: No tenderness.  Abdominal:     General: Bowel sounds are normal.     Palpations: Abdomen is soft.  Musculoskeletal:        General: Normal range of motion.     Cervical back: Normal range of motion.  Skin:    General: Skin is warm and dry.  Neurological:     Mental Status: She is alert and oriented to person, place, and time.     Coordination: Coordination normal.  Psychiatric:        Behavior: Behavior normal. Behavior is cooperative.        Thought Content: Thought content normal.        Judgment: Judgment normal.         Patient has been counseled extensively about nutrition and exercise as well as the importance of adherence with medications and regular follow-up. The patient was given clear instructions to go to ER or return to medical center if symptoms don't improve, worsen or new problems develop. The patient verbalized understanding.   Follow-up: Return for PAP SMEAR.   [name], FNP-BC [facility] [place], [place]   05/05/2021, 10:41 AM

## 2021-05-05 NOTE — Progress Notes (Signed)
Still having swelling in legs.

## 2021-05-07 LAB — POTASSIUM: Potassium: 3.7 mmol/L (ref 3.5–5.2)

## 2021-05-07 LAB — BRAIN NATRIURETIC PEPTIDE: BNP: 37.8 pg/mL (ref 0.0–100.0)

## 2021-05-22 ENCOUNTER — Ambulatory Visit
Admission: RE | Admit: 2021-05-22 | Discharge: 2021-05-22 | Disposition: A | Discharge status: Home / Self Care | Payer: 59 | Source: Ambulatory Visit | Attending: Nurse Practitioner | Admitting: Nurse Practitioner

## 2021-05-22 ENCOUNTER — Other Ambulatory Visit: Payer: Self-pay

## 2021-05-22 DIAGNOSIS — Z1231 Encounter for screening mammogram for malignant neoplasm of breast: Secondary | ICD-10-CM

## 2021-05-24 ENCOUNTER — Other Ambulatory Visit: Payer: Self-pay | Admitting: Nurse Practitioner

## 2021-05-24 DIAGNOSIS — R928 Other abnormal and inconclusive findings on diagnostic imaging of breast: Secondary | ICD-10-CM

## 2021-06-05 ENCOUNTER — Other Ambulatory Visit: Payer: Self-pay | Admitting: Nurse Practitioner

## 2021-06-05 DIAGNOSIS — I890 Lymphedema, not elsewhere classified: Secondary | ICD-10-CM

## 2021-06-06 ENCOUNTER — Ambulatory Visit: Payer: 59

## 2021-06-06 ENCOUNTER — Other Ambulatory Visit: Payer: Self-pay

## 2021-06-06 ENCOUNTER — Ambulatory Visit
Admission: RE | Admit: 2021-06-06 | Discharge: 2021-06-06 | Disposition: A | Discharge status: Home / Self Care | Payer: 59 | Source: Ambulatory Visit | Attending: Nurse Practitioner | Admitting: Nurse Practitioner

## 2021-06-06 DIAGNOSIS — R928 Other abnormal and inconclusive findings on diagnostic imaging of breast: Secondary | ICD-10-CM

## 2021-06-20 ENCOUNTER — Other Ambulatory Visit: Payer: Self-pay

## 2021-06-20 ENCOUNTER — Ambulatory Visit: Payer: 59 | Admitting: Nurse Practitioner

## 2021-06-20 NOTE — Progress Notes (Signed)
Patient had to reschedule for PAP. Currently on menstrual cycle

## 2021-07-04 ENCOUNTER — Other Ambulatory Visit: Payer: Self-pay

## 2021-07-04 ENCOUNTER — Encounter: Payer: Self-pay | Admitting: Nurse Practitioner

## 2021-07-04 ENCOUNTER — Ambulatory Visit: Payer: 59 | Attending: Nurse Practitioner | Admitting: Nurse Practitioner

## 2021-07-04 ENCOUNTER — Other Ambulatory Visit (HOSPITAL_COMMUNITY)
Admission: RE | Admit: 2021-07-04 | Discharge: 2021-07-04 | Disposition: A | Discharge status: Home / Self Care | Payer: 59 | Source: Ambulatory Visit | Attending: Nurse Practitioner | Admitting: Nurse Practitioner

## 2021-07-04 VITALS — BP 116/76 | HR 88 | Resp 16 | Wt 173.8 lb

## 2021-07-04 DIAGNOSIS — Z124 Encounter for screening for malignant neoplasm of cervix: Secondary | ICD-10-CM | POA: Insufficient documentation

## 2021-07-04 DIAGNOSIS — R7303 Prediabetes: Secondary | ICD-10-CM

## 2021-07-04 DIAGNOSIS — Z114 Encounter for screening for human immunodeficiency virus [HIV]: Secondary | ICD-10-CM

## 2021-07-04 DIAGNOSIS — I10 Essential (primary) hypertension: Secondary | ICD-10-CM

## 2021-07-04 MED ORDER — ATENOLOL-CHLORTHALIDONE 50-25 MG PO TABS: 1.0000 | ORAL_TABLET | Freq: Every day | ORAL | 1 refills | Status: DC

## 2021-07-04 NOTE — Progress Notes (Signed)
Assessment & Plan:  Meagan Padilla was seen today for gynecologic exam.  Diagnoses and all orders for this visit:  Encounter for Papanicolaou smear for cervical cancer screening -     Cytology - PAP -     Cervicovaginal ancillary only  Encounter for screening for HIV -     HIV antibody (with reflex)  Prediabetes -     Hemoglobin A1c -     Cancel: Basic metabolic panel  Essential hypertension -     atenolol-chlorthalidone (TENORETIC) 50-25 MG tablet; Take 1 tablet by mouth daily.   Patient has been counseled on age-appropriate routine health concerns for screening and prevention. These are reviewed and up-to-date. Referrals have been placed accordingly. Immunizations are up-to-date or declined.    Subjective:   Chief Complaint  Patient presents with   Gynecologic Exam   HPI Meagan Padilla 45 y.o. female presents to office today for PAP smear.  Blood pressure is well controlled. She does have chronic intermittent peripheral edema L>R. Did not wear her compression stockings today. Takes prn lasix for this. Does not endorse chest pain, shortness of breath, palpitations, lightheadedness, dizziness, headaches or visual disturbances.  BP Readings from Last 3 Encounters:  07/04/21 116/76  05/05/21 128/78  12/08/20 120/79     Review of Systems  Constitutional: Negative.  Negative for chills, fever, malaise/fatigue and weight loss.  Respiratory: Negative.  Negative for cough, shortness of breath and wheezing.   Cardiovascular:  Positive for leg swelling. Negative for chest pain and orthopnea.  Gastrointestinal:  Negative for abdominal pain.  Genitourinary: Negative.  Negative for flank pain.  Skin: Negative.  Negative for rash.  Psychiatric/Behavioral:  Negative for suicidal ideas.    Past Medical History:  Diagnosis Date   Hypertension    Murmur, cardiac     Past Surgical History:  Procedure Laterality Date   DENTAL SURGERY      Family History  Problem Relation Age of  Onset   Hypertension Mother    Hypertension Father    Hypertension Brother     Social History Reviewed with no changes to be made today.   Outpatient Medications Prior to Visit  Medication Sig Dispense Refill   acetaminophen (TYLENOL) 500 MG tablet Take 500 mg by mouth every 6 (six) hours as needed for headache.      dicyclomine (BENTYL) 20 MG tablet Take 1 tablet (20 mg total) by mouth 2 (two) times daily as needed for spasms. 20 tablet 0   fluticasone (FLONASE) 50 MCG/ACT nasal spray Place 1 spray into both nostrils daily. 16 g 0   furosemide (LASIX) 20 MG tablet TAKE 1/2 TABLET BY MOUTH EVERY DAY 30 tablet 1   Misc. Devices MISC Please provide patient with custom fitted compression stockings I89.0 1 each 0   ondansetron (ZOFRAN ODT) 4 MG disintegrating tablet Take 1 tablet (4 mg total) by mouth every 8 (eight) hours as needed for nausea or vomiting. 20 tablet 0   atenolol-chlorthalidone (TENORETIC) 50-25 MG tablet Take 1 tablet by mouth daily. 90 tablet 0   No facility-administered medications prior to visit.    No Known Allergies     Objective:    BP 116/76   Pulse 88   Resp 16   Wt 173 lb 12.8 oz (78.8 kg)   SpO2 100%   BMI 31.79 kg/m  Wt Readings from Last 3 Encounters:  07/04/21 173 lb 12.8 oz (78.8 kg)  05/05/21 178 lb (80.7 kg)  12/08/20 191 lb (86.6 kg)  Physical Exam Exam conducted with a chaperone present.  Constitutional:      Appearance: She is well-developed.  HENT:     Head: Normocephalic.  Cardiovascular:     Rate and Rhythm: Normal rate and regular rhythm.     Heart sounds: Normal heart sounds.  Pulmonary:     Effort: Pulmonary effort is normal.     Breath sounds: Normal breath sounds.  Abdominal:     General: Bowel sounds are normal.     Palpations: Abdomen is soft.     Hernia: There is no hernia in the left inguinal area.  Genitourinary:    Exam position: Lithotomy position.     Labia:        Right: No rash, tenderness, lesion or injury.         Left: No rash, tenderness, lesion or injury.      Vagina: Normal. No signs of injury and foreign body. No vaginal discharge, erythema, tenderness or bleeding.     Cervix: No cervical motion tenderness or friability.     Uterus: Not deviated and not enlarged.      Adnexa:        Right: No mass, tenderness or fullness.         Left: No mass, tenderness or fullness.       Rectum: Normal. No external hemorrhoid.  Musculoskeletal:     Right lower leg: Swelling present.     Left lower leg: Swelling present.     Right ankle: Swelling present.     Left ankle: Swelling present.     Right foot: Swelling present.     Left foot: Swelling present.     Comments: Non pitting BLE swelling  Lymphadenopathy:     Lower Body: No right inguinal adenopathy. No left inguinal adenopathy.  Skin:    General: Skin is warm and dry.  Neurological:     Mental Status: She is alert and oriented to person, place, and time.  Psychiatric:        Behavior: Behavior normal.        Thought Content: Thought content normal.        Judgment: Judgment normal.         Patient has been counseled extensively about nutrition and exercise as well as the importance of adherence with medications and regular follow-up. The patient was given clear instructions to go to ER or return to medical center if symptoms don't improve, worsen or new problems develop. The patient verbalized understanding.   Follow-up: Return in about 3 months (around 10/03/2021).   [name], FNP-BC [facility] and [facility], [place]   07/04/2021, 12:27 PM

## 2021-07-05 LAB — CERVICOVAGINAL ANCILLARY ONLY
Bacterial Vaginitis (gardnerella): POSITIVE — AB
Candida Glabrata: NEGATIVE
Candida Vaginitis: NEGATIVE
Chlamydia: NEGATIVE
Comment: NEGATIVE
Comment: NEGATIVE
Comment: NEGATIVE
Comment: NEGATIVE
Comment: NEGATIVE
Comment: NORMAL
Neisseria Gonorrhea: NEGATIVE
Trichomonas: NEGATIVE

## 2021-07-05 LAB — HIV ANTIBODY (ROUTINE TESTING W REFLEX): HIV Screen 4th Generation wRfx: NONREACTIVE

## 2021-07-05 LAB — HEMOGLOBIN A1C
Est. average glucose Bld gHb Est-mCnc: 126 mg/dL
Hgb A1c MFr Bld: 6 % — ABNORMAL HIGH (ref 4.8–5.6)

## 2021-07-06 ENCOUNTER — Other Ambulatory Visit: Payer: Self-pay | Admitting: Nurse Practitioner

## 2021-07-06 MED ORDER — METRONIDAZOLE 500 MG PO TABS: 500.0000 mg | ORAL_TABLET | Freq: Two times a day (BID) | ORAL | 0 refills | Status: AC

## 2021-07-07 LAB — CYTOLOGY - PAP
Comment: NEGATIVE
Diagnosis: NEGATIVE
High risk HPV: NEGATIVE

## 2021-07-17 ENCOUNTER — Ambulatory Visit: Payer: Self-pay | Admitting: *Deleted

## 2021-07-17 NOTE — Telephone Encounter (Signed)
Pt states at her cpe on 9/13,  Meagan Padilla found she had Bacterial Vaginitis. Pt prescribed metroNIDAZOLE .  Pt states this medication is too strong.  She says it makes her dizzy to take this med, she has stopped taking..  Would like to know if there is a cream she can do instead.  Reason for Disposition  [1] Caller has URGENT medicine question about med that PCP or specialist prescribed AND [2] triager unable to answer question  Answer Assessment - Initial Assessment Questions 1. NAME of MEDICATION: "What medicine are you calling about?"     Metronidazole 2. QUESTION: "What is your question?" (e.g., double dose of medicine, side effect)     Medication- makes her dizzy, upset stomach 3. PRESCRIBING HCP: "Who prescribed it?" Reason: if prescribed by specialist, call should be referred to that group.     PCP 4. SYMPTOMS: "Do you have any symptoms?"     Medication is upsetting stomach- patient request alternative  5. SEVERITY: If symptoms are present, ask "Are they mild, moderate or severe?"     Moderate/severe- patient stopped taking and is requesting alternative- [facility]  Protocols used: Medication Question Call-A-AH

## 2021-07-18 ENCOUNTER — Other Ambulatory Visit: Payer: Self-pay | Admitting: Nurse Practitioner

## 2021-07-18 MED ORDER — METRONIDAZOLE 0.75 % VA GEL: 1.0000 | Freq: Two times a day (BID) | VAGINAL | 0 refills | Status: AC

## 2021-07-18 NOTE — Telephone Encounter (Signed)
Gel sent

## 2021-10-04 ENCOUNTER — Ambulatory Visit: Payer: 59 | Admitting: Nurse Practitioner

## 2021-10-06 ENCOUNTER — Encounter: Payer: Self-pay | Admitting: Nurse Practitioner

## 2021-10-17 ENCOUNTER — Ambulatory Visit (HOSPITAL_BASED_OUTPATIENT_CLINIC_OR_DEPARTMENT_OTHER): Payer: 59 | Admitting: Nurse Practitioner

## 2021-10-17 ENCOUNTER — Encounter: Payer: Self-pay | Admitting: Nurse Practitioner

## 2021-10-17 DIAGNOSIS — I10 Essential (primary) hypertension: Secondary | ICD-10-CM | POA: Diagnosis not present

## 2021-10-17 DIAGNOSIS — D649 Anemia, unspecified: Secondary | ICD-10-CM | POA: Diagnosis not present

## 2021-10-17 NOTE — Progress Notes (Signed)
Virtual Visit via Telephone Note Due to national recommendations of social distancing due to [place] 19, telehealth visit is felt to be most appropriate for this patient at this time.  I discussed the limitations, risks, security and privacy concerns of performing an evaluation and management service by telephone and the availability of in person appointments. I also discussed with the patient that there may be a patient responsible charge related to this service. The patient expressed understanding and agreed to proceed.    I connected with Meagan Padilla on 10/17/21  at   9:10 AM EST  EDT by telephone and verified that I am speaking with the correct person using two identifiers.  Location of Patient: Private Residence   Location of Provider: [place] and [employer] Office    Persons participating in Telemedicine visit: Meagan Rankins FNP-BC Meagan Padilla    History of Present Illness: Telemedicine visit for: HTN  has a past medical history of Hypertension and Murmur, cardiac.    HTN Notes most recent blood pressure reading at home 132/86. She is currently taking tenoretic 50-25 mg daily and furosemide as needed for BLE edema.  BP Readings from Last 3 Encounters:  07/04/21 116/76  05/05/21 128/78  12/08/20 120/79     Foot Pain Dropped particle board on big toe of left foot. She endorses pain but is able to bend the toe.     Past Medical History:  Diagnosis Date   Hypertension    Murmur, cardiac     Past Surgical History:  Procedure Laterality Date   DENTAL SURGERY      Family History  Problem Relation Age of Onset   Hypertension Mother    Hypertension Father    Hypertension Brother     Social History   Socioeconomic History   Marital status: Single    Spouse name: Not on file   Number of children: Not on file   Years of education: Not on file   Highest education level: Not on file  Occupational History   Not on file  Tobacco Use   Smoking  status: Never   Smokeless tobacco: Never  Vaping Use   Vaping Use: Never used  Substance and Sexual Activity   Alcohol use: No   Drug use: No   Sexual activity: Not Currently  Other Topics Concern   Not on file  Social History Narrative   Not on file   Social Determinants of Health   Financial Resource Strain: Not on file  Food Insecurity: Not on file  Transportation Needs: Not on file  Physical Activity: Not on file  Stress: Not on file  Social Connections: Not on file     Observations/Objective: Awake, alert and oriented x 3   Review of Systems  Constitutional:  Negative for fever, malaise/fatigue and weight loss.  HENT: Negative.  Negative for nosebleeds.   Eyes: Negative.  Negative for blurred vision, double vision and photophobia.  Respiratory: Negative.  Negative for cough and shortness of breath.   Cardiovascular: Negative.  Negative for chest pain, palpitations and leg swelling.  Gastrointestinal: Negative.  Negative for heartburn, nausea and vomiting.  Musculoskeletal: Negative.  Negative for myalgias.  Neurological: Negative.  Negative for dizziness, focal weakness, seizures and headaches.  Psychiatric/Behavioral: Negative.  Negative for suicidal ideas.    Assessment and Plan: Diagnoses and all orders for this visit:  Essential hypertension -     CMP14+EGFR; Future Continue all antihypertensives as prescribed.  Remember to bring in your blood  pressure log with you for your follow up appointment.  DASH/Mediterranean Diets are healthier choices for HTN.    Anemia, unspecified type -     CBC; Future     Follow Up Instructions Return if symptoms worsen or fail to improve.     I discussed the assessment and treatment plan with the patient. The patient was provided an opportunity to ask questions and all were answered. The patient agreed with the plan and demonstrated an understanding of the instructions.   The patient was advised to call back or seek an  in-person evaluation if the symptoms worsen or if the condition fails to improve as anticipated.  I provided 10 minutes of non-face-to-face time during this encounter including median intraservice time, reviewing previous notes, labs, imaging, medications and explaining diagnosis and management.  [name], FNP-BC

## 2021-10-18 ENCOUNTER — Encounter: Payer: Self-pay | Admitting: Nurse Practitioner

## 2021-12-18 ENCOUNTER — Ambulatory Visit: Payer: 59 | Attending: Nurse Practitioner | Admitting: Nurse Practitioner

## 2021-12-18 ENCOUNTER — Encounter: Payer: Self-pay | Admitting: Nurse Practitioner

## 2021-12-18 VITALS — BP 138/85 | HR 85 | Resp 18 | Ht 62.0 in | Wt 164.0 lb

## 2021-12-18 DIAGNOSIS — R600 Localized edema: Secondary | ICD-10-CM

## 2021-12-18 DIAGNOSIS — Z23 Encounter for immunization: Secondary | ICD-10-CM

## 2021-12-18 DIAGNOSIS — E785 Hyperlipidemia, unspecified: Secondary | ICD-10-CM | POA: Diagnosis not present

## 2021-12-18 DIAGNOSIS — D649 Anemia, unspecified: Secondary | ICD-10-CM

## 2021-12-18 DIAGNOSIS — R7303 Prediabetes: Secondary | ICD-10-CM

## 2021-12-18 DIAGNOSIS — I10 Essential (primary) hypertension: Secondary | ICD-10-CM

## 2021-12-18 LAB — POCT GLYCOSYLATED HEMOGLOBIN (HGB A1C): Hemoglobin A1C: 5.6 % (ref 4.0–5.6)

## 2021-12-18 LAB — GLUCOSE, POCT (MANUAL RESULT ENTRY): POC Glucose: 82 mg/dl (ref 70–99)

## 2021-12-18 MED ORDER — ATENOLOL-CHLORTHALIDONE 50-25 MG PO TABS: 1.0000 | ORAL_TABLET | Freq: Every day | ORAL | 1 refills | Status: DC

## 2021-12-18 MED ORDER — FUROSEMIDE 20 MG PO TABS: 10.0000 mg | ORAL_TABLET | Freq: Every day | ORAL | 1 refills | Status: DC

## 2021-12-18 NOTE — Progress Notes (Signed)
Assessment & Plan:  Meagan Padilla was seen today for prediabetes and hypertension.  Diagnoses and all orders for this visit:  Essential hypertension -     atenolol-chlorthalidone (TENORETIC) 50-25 MG tablet; Take 1 tablet by mouth daily. -     CMP14+EGFR Continue all antihypertensives as prescribed.  Remember to bring in your blood pressure log with you for your follow up appointment.  DASH/Mediterranean Diets are healthier choices for HTN.    Prediabetes -     POCT glycosylated hemoglobin (Hb A1C) -     POCT glucose (manual entry)  Bilateral lower extremity edema -     furosemide (LASIX) 20 MG tablet; Take 0.5 tablets (10 mg total) by mouth daily. DASH DIET Wear compression stockings daily   Dyslipidemia, goal LDL below 70 -     Lipid panel INSTRUCTIONS: Work on a low fat, heart healthy diet and participate in regular aerobic exercise program by working out at least 150 minutes per week; 5 days a week-30 minutes per day. Avoid red meat/beef/steak,  fried foods. junk foods, sodas, sugary drinks, unhealthy snacking, alcohol and smoking.  Drink at least 80 oz of water per day and monitor your carbohydrate intake daily.    Anemia, unspecified type -     CBC    Patient has been counseled on age-appropriate routine health concerns for screening and prevention. These are reviewed and up-to-date. Referrals have been placed accordingly. Immunizations are up-to-date or declined.    Subjective:   Chief Complaint  Patient presents with   Prediabetes   Hypertension   HPI Meagan Padilla 46 y.o. female presents to office today for follow up to HTN and prediabetes.   Prediabetes Well controlled with diet alone at this time. Weight is down intentionally about 9lb since last visit.  Lab Results  Component Value Date   HGBA1C 5.6 12/18/2021     HTN Slightly elevated today as she has been without her blood pressure medications for several weeks now. Will refill her tenoretic 50-35m  daily. She also takes lasix prn for BLE edema. Needs to work on less sodium intake as well.   BP Readings from Last 3 Encounters:  12/18/21 138/85  07/04/21 116/76  05/05/21 128/78      Review of Systems  Constitutional:  Negative for fever, malaise/fatigue and weight loss.  HENT: Negative.  Negative for nosebleeds.   Eyes: Negative.  Negative for blurred vision, double vision and photophobia.  Respiratory: Negative.  Negative for cough and shortness of breath.   Cardiovascular:  Positive for leg swelling. Negative for chest pain and palpitations.  Gastrointestinal: Negative.  Negative for heartburn, nausea and vomiting.  Musculoskeletal: Negative.  Negative for myalgias.  Neurological: Negative.  Negative for dizziness, focal weakness, seizures and headaches.  Psychiatric/Behavioral: Negative.  Negative for suicidal ideas.    Past Medical History:  Diagnosis Date   Hypertension    Murmur, cardiac     Past Surgical History:  Procedure Laterality Date   DENTAL SURGERY      Family History  Problem Relation Age of Onset   Hypertension Mother    Hypertension Father    Hypertension Brother     Social History Reviewed with no changes to be made today.   Outpatient Medications Prior to Visit  Medication Sig Dispense Refill   acetaminophen (TYLENOL) 500 MG tablet Take 500 mg by mouth every 6 (six) hours as needed for headache.      dicyclomine (BENTYL) 20 MG tablet Take 1 tablet (20  mg total) by mouth 2 (two) times daily as needed for spasms. 20 tablet 0   fluticasone (FLONASE) 50 MCG/ACT nasal spray Place 1 spray into both nostrils daily. 16 g 0   Misc. Devices MISC Please provide patient with custom fitted compression stockings I89.0 (Patient not taking: Reported on 12/18/2021) 1 each 0   ondansetron (ZOFRAN ODT) 4 MG disintegrating tablet Take 1 tablet (4 mg total) by mouth every 8 (eight) hours as needed for nausea or vomiting. (Patient not taking: Reported on 12/18/2021) 20  tablet 0   atenolol-chlorthalidone (TENORETIC) 50-25 MG tablet Take 1 tablet by mouth daily. 90 tablet 1   furosemide (LASIX) 20 MG tablet TAKE 1/2 TABLET BY MOUTH EVERY DAY (Patient not taking: Reported on 12/18/2021) 30 tablet 1   No facility-administered medications prior to visit.    No Known Allergies     Objective:    BP 138/85    Pulse 85    Resp 18    Ht 5' 2"  (1.575 m)    Wt 164 lb (74.4 kg)    LMP 12/01/2021 (Approximate)    SpO2 100%    BMI 30.00 kg/m  Wt Readings from Last 3 Encounters:  12/18/21 164 lb (74.4 kg)  07/04/21 173 lb 12.8 oz (78.8 kg)  05/05/21 178 lb (80.7 kg)    Physical Exam Vitals and nursing note reviewed.  Constitutional:      Appearance: She is well-developed.  HENT:     Head: Normocephalic and atraumatic.  Cardiovascular:     Rate and Rhythm: Normal rate and regular rhythm.     Heart sounds: Normal heart sounds. No murmur heard.   No friction rub. No gallop.  Pulmonary:     Effort: Pulmonary effort is normal. No tachypnea or respiratory distress.     Breath sounds: Normal breath sounds. No decreased breath sounds, wheezing, rhonchi or rales.  Chest:     Chest wall: No tenderness.  Abdominal:     General: Bowel sounds are normal.     Palpations: Abdomen is soft.  Musculoskeletal:        General: Normal range of motion.     Cervical back: Normal range of motion.  Skin:    General: Skin is warm and dry.  Neurological:     Mental Status: She is alert and oriented to person, place, and time.     Coordination: Coordination normal.  Psychiatric:        Behavior: Behavior normal. Behavior is cooperative.        Thought Content: Thought content normal.        Judgment: Judgment normal.         Patient has been counseled extensively about nutrition and exercise as well as the importance of adherence with medications and regular follow-up. The patient was given clear instructions to go to ER or return to medical center if symptoms don't  improve, worsen or new problems develop. The patient verbalized understanding.   Follow-up: Return in about 6 months (around 06/17/2022).   [name], FNP-BC [facility] and [facility] [place], [place]   12/18/2021, 10:04 AM

## 2021-12-19 LAB — CMP14+EGFR
ALT: 10 IU/L (ref 0–32)
AST: 15 IU/L (ref 0–40)
Albumin/Globulin Ratio: 1.5 (ref 1.2–2.2)
Albumin: 4 g/dL (ref 3.8–4.8)
Alkaline Phosphatase: 69 IU/L (ref 44–121)
BUN/Creatinine Ratio: 11 (ref 9–23)
BUN: 8 mg/dL (ref 6–24)
Bilirubin Total: <0.2 mg/dL (ref 0.0–1.2)
CO2: 21 mmol/L (ref 20–29)
Calcium: 9.1 mg/dL (ref 8.7–10.2)
Chloride: 105 mmol/L (ref 96–106)
Creatinine, Ser: 0.74 mg/dL (ref 0.57–1.00)
Globulin, Total: 2.7 g/dL (ref 1.5–4.5)
Glucose: 87 mg/dL (ref 70–99)
Potassium: 4.2 mmol/L (ref 3.5–5.2)
Sodium: 138 mmol/L (ref 134–144)
Total Protein: 6.7 g/dL (ref 6.0–8.5)
eGFR: 102 mL/min/{1.73_m2} (ref >59)

## 2021-12-19 LAB — LIPID PANEL
Chol/HDL Ratio: 2.4 ratio (ref 0.0–4.4)
Cholesterol, Total: 137 mg/dL (ref 100–199)
HDL: 57 mg/dL (ref >39)
LDL Chol Calc (NIH): 70 mg/dL (ref 0–99)
Triglycerides: 40 mg/dL (ref 0–149)
VLDL Cholesterol Cal: 10 mg/dL (ref 5–40)

## 2021-12-19 LAB — CBC
Hematocrit: 33.7 % — ABNORMAL LOW (ref 34.0–46.6)
Hemoglobin: 10.4 g/dL — ABNORMAL LOW (ref 11.1–15.9)
MCH: 23.1 pg — ABNORMAL LOW (ref 26.6–33.0)
MCHC: 30.9 g/dL — ABNORMAL LOW (ref 31.5–35.7)
MCV: 75 fL — ABNORMAL LOW (ref 79–97)
Platelets: 248 10*3/uL (ref 150–450)
RBC: 4.51 x10E6/uL (ref 3.77–5.28)
RDW: 15.9 % — ABNORMAL HIGH (ref 11.7–15.4)
WBC: 6.5 10*3/uL (ref 3.4–10.8)

## 2021-12-23 ENCOUNTER — Other Ambulatory Visit: Payer: Self-pay | Admitting: Nurse Practitioner

## 2021-12-23 DIAGNOSIS — D649 Anemia, unspecified: Secondary | ICD-10-CM

## 2021-12-23 MED ORDER — IRON (FERROUS SULFATE) 325 (65 FE) MG PO TABS: 325.0000 mg | ORAL_TABLET | Freq: Every day | ORAL | 3 refills | Status: DC

## 2022-03-20 ENCOUNTER — Emergency Department (HOSPITAL_COMMUNITY)
Admission: EM | Admit: 2022-03-20 | Discharge: 2022-03-20 | Disposition: A | Discharge status: Home / Self Care | Payer: 59 | Attending: Emergency Medicine | Admitting: Emergency Medicine

## 2022-03-20 ENCOUNTER — Encounter (HOSPITAL_COMMUNITY): Payer: Self-pay

## 2022-03-20 ENCOUNTER — Emergency Department (HOSPITAL_COMMUNITY): Payer: 59

## 2022-03-20 ENCOUNTER — Other Ambulatory Visit: Payer: Self-pay

## 2022-03-20 DIAGNOSIS — Y92410 Unspecified street and highway as the place of occurrence of the external cause: Secondary | ICD-10-CM | POA: Diagnosis not present

## 2022-03-20 DIAGNOSIS — M25561 Pain in right knee: Secondary | ICD-10-CM | POA: Insufficient documentation

## 2022-03-20 MED ORDER — METHOCARBAMOL 500 MG PO TABS: 500.0000 mg | ORAL_TABLET | Freq: Two times a day (BID) | ORAL | 0 refills | Status: DC
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
## 2022-04-05 ENCOUNTER — Ambulatory Visit (HOSPITAL_COMMUNITY)
Admission: EM | Admit: 2022-04-05 | Discharge: 2022-04-05 | Disposition: A | Discharge status: Home / Self Care | Payer: 59

## 2022-04-05 ENCOUNTER — Encounter (HOSPITAL_COMMUNITY): Payer: Self-pay

## 2022-04-05 DIAGNOSIS — M7918 Myalgia, other site: Secondary | ICD-10-CM
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
## 2022-04-05 NOTE — Discharge Instructions (Addendum)
Please keep follow up appt with PCP as scheduled. Take home meds as prescribed. May take OTC meds for symptom management.
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
## 2022-04-12 ENCOUNTER — Ambulatory Visit: Payer: 59 | Attending: Physician Assistant | Admitting: Physician Assistant

## 2022-04-12 ENCOUNTER — Encounter: Payer: Self-pay | Admitting: Physician Assistant

## 2022-04-12 DIAGNOSIS — D649 Anemia, unspecified: Secondary | ICD-10-CM

## 2022-04-12 DIAGNOSIS — I10 Essential (primary) hypertension: Secondary | ICD-10-CM

## 2022-04-12 MED ORDER — IRON (FERROUS SULFATE) 325 (65 FE) MG PO TABS: 325.0000 mg | ORAL_TABLET | Freq: Every day | ORAL | 3 refills | Status: DC

## 2022-04-12 MED ORDER — ATENOLOL-CHLORTHALIDONE 50-25 MG PO TABS: 1.0000 | ORAL_TABLET | Freq: Every day | ORAL | 1 refills | Status: DC

## 2022-06-18 ENCOUNTER — Ambulatory Visit: Payer: Commercial Managed Care - HMO | Attending: Nurse Practitioner | Admitting: Nurse Practitioner

## 2022-06-18 ENCOUNTER — Encounter: Payer: Self-pay | Admitting: Nurse Practitioner

## 2022-06-18 VITALS — BP 149/93 | HR 80 | Temp 97.8°F | Wt 163.4 lb

## 2022-06-18 DIAGNOSIS — D500 Iron deficiency anemia secondary to blood loss (chronic): Secondary | ICD-10-CM

## 2022-06-18 DIAGNOSIS — R600 Localized edema: Secondary | ICD-10-CM

## 2022-06-18 DIAGNOSIS — I10 Essential (primary) hypertension: Secondary | ICD-10-CM

## 2022-06-18 DIAGNOSIS — Z1231 Encounter for screening mammogram for malignant neoplasm of breast: Secondary | ICD-10-CM | POA: Diagnosis not present

## 2022-06-18 DIAGNOSIS — R7303 Prediabetes: Secondary | ICD-10-CM | POA: Diagnosis not present

## 2022-06-18 MED ORDER — ATENOLOL-CHLORTHALIDONE 50-25 MG PO TABS: 1.5000 | ORAL_TABLET | Freq: Every day | ORAL | 1 refills | Status: DC

## 2022-06-18 MED ORDER — FUROSEMIDE 20 MG PO TABS: 10.0000 mg | ORAL_TABLET | Freq: Every day | ORAL | 1 refills | Status: DC

## 2022-06-18 MED ORDER — FERROUS GLUCONATE 324 (38 FE) MG PO TABS: 324.0000 mg | ORAL_TABLET | Freq: Every day | ORAL | 3 refills | Status: DC

## 2022-06-18 NOTE — Progress Notes (Signed)
Assessment & Plan:  Meagan Padilla was seen today for hypertension.  Diagnoses and all orders for this visit:  Essential hypertension -     CMP14+EGFR -     atenolol-chlorthalidone (TENORETIC) 50-25 MG tablet; Take 1.5 tablets by mouth daily. Continue all antihypertensives as prescribed.  Reminded to bring in blood pressure log for follow  up appointment.  RECOMMENDATIONS: DASH/Mediterranean Diets are healthier choices for HTN.    Prediabetes -     Hemoglobin A1c  Breast cancer screening by mammogram -     MM 3D SCREEN BREAST BILATERAL; Future  Bilateral lower extremity edema -     furosemide (LASIX) 20 MG tablet; Take 0.5 tablets (10 mg total) by mouth daily. DASH DIET/MEDITERRANEAN DIET RECOMMENDED  Iron deficiency anemia due to chronic blood loss -     ferrous gluconate (FERGON) 324 MG tablet; Take 1 tablet (324 mg total) by mouth daily with breakfast. -     Iron, TIBC and Ferritin Panel -     CBC with Differential -     US PELVIC COMPLETE WITH TRANSVAGINAL; Future    Patient has been counseled on age-appropriate routine health concerns for screening and prevention. These are reviewed and up-to-date. Referrals have been placed accordingly. Immunizations are up-to-date or declined.    Subjective:   Chief Complaint  Patient presents with   Hypertension   HPI Meagan Padilla 46 y.o. female presents to office today for follow up to HTN  She has a past medical history of Hypertension and Murmur, cardiac.   Patient has been counseled on age-appropriate routine health concerns for screening and prevention. These are reviewed and up-to-date. Referrals have been placed accordingly. Immunizations are up-to-date or declined.     MAMMOGRAM: Overdue. Ordered today PAP SMEAR: UTD    AUB History of IDA with menorrhagia on days 2 and 3 of 5 days menstrual cycle. Has been unable to take iron daily due to GI upset. Recommendations given regarding alternative medications and ferrous  gluconate was sent today.   HTN Not at goal. BP average at home: mid 130/80s. She is currently taking tenoretic 50-25 mg daily. Will increase to 75-37.5 mg daily. She states she has been walking more (2-3 times per week 30 minutes or more) and trying to eat healthier.  BP Readings from Last 3 Encounters:  06/18/22 (!) 149/93  04/12/22 126/83  04/05/22 (!) 142/92    Prediabetes Well controlled with diet only at this time Lab Results  Component Value Date   HGBA1C 5.6 12/18/2021     Review of Systems  Constitutional:  Negative for fever, malaise/fatigue and weight loss.  HENT: Negative.  Negative for nosebleeds.   Eyes: Negative.  Negative for blurred vision, double vision and photophobia.  Respiratory: Negative.  Negative for cough and shortness of breath.   Cardiovascular: Negative.  Negative for chest pain, palpitations and leg swelling.  Gastrointestinal: Negative.  Negative for heartburn, nausea and vomiting.  Genitourinary:        Menorrhagia  Musculoskeletal: Negative.  Negative for myalgias.  Neurological: Negative.  Negative for dizziness, focal weakness, seizures and headaches.  Psychiatric/Behavioral: Negative.  Negative for suicidal ideas.     Past Medical History:  Diagnosis Date   Hypertension    Murmur, cardiac     Past Surgical History:  Procedure Laterality Date   DENTAL SURGERY      Family History  Problem Relation Age of Onset   Hypertension Mother    Hypertension Father    Hypertension  Brother     Social History Reviewed with no changes to be made today.   Outpatient Medications Prior to Visit  Medication Sig Dispense Refill   acetaminophen (TYLENOL) 500 MG tablet Take 500 mg by mouth every 6 (six) hours as needed for headache.      dicyclomine (BENTYL) 20 MG tablet Take 1 tablet (20 mg total) by mouth 2 (two) times daily as needed for spasms. 20 tablet 0   fluticasone (FLONASE) 50 MCG/ACT nasal spray Place 1 spray into both nostrils daily. 16 g  0   methocarbamol (ROBAXIN) 500 MG tablet Take 1 tablet (500 mg total) by mouth 2 (two) times daily. 20 tablet 0   Misc. Devices MISC Please provide patient with custom fitted compression stockings I89.0 1 each 0   ondansetron (ZOFRAN ODT) 4 MG disintegrating tablet Take 1 tablet (4 mg total) by mouth every 8 (eight) hours as needed for nausea or vomiting. 20 tablet 0   atenolol-chlorthalidone (TENORETIC) 50-25 MG tablet Take 1 tablet by mouth daily. 90 tablet 1   furosemide (LASIX) 20 MG tablet Take 0.5 tablets (10 mg total) by mouth daily. 45 tablet 1   Iron, Ferrous Sulfate, 325 (65 Fe) MG TABS Take 325 mg by mouth daily. (Patient not taking: Reported on 06/18/2022) 90 tablet 3   No facility-administered medications prior to visit.    No Known Allergies     Objective:    BP (!) 149/93   Pulse 80   Temp 97.8 F (36.6 C) (Oral)   Wt 163 lb 6.4 oz (74.1 kg)   SpO2 98%   BMI 29.89 kg/m  Wt Readings from Last 3 Encounters:  06/18/22 163 lb 6.4 oz (74.1 kg)  04/12/22 166 lb 3.2 oz (75.4 kg)  12/18/21 164 lb (74.4 kg)    Physical Exam Vitals and nursing note reviewed.  Constitutional:      Appearance: She is well-developed.  HENT:     Head: Normocephalic and atraumatic.  Cardiovascular:     Rate and Rhythm: Normal rate and regular rhythm.     Heart sounds: Murmur heard.     No friction rub. No gallop.  Pulmonary:     Effort: Pulmonary effort is normal. No tachypnea or respiratory distress.     Breath sounds: Normal breath sounds. No decreased breath sounds, wheezing, rhonchi or rales.  Chest:     Chest wall: No tenderness.  Abdominal:     General: Bowel sounds are normal.     Palpations: Abdomen is soft.  Musculoskeletal:        General: Normal range of motion.     Cervical back: Normal range of motion.  Skin:    General: Skin is warm and dry.  Neurological:     Mental Status: She is alert and oriented to person, place, and time.     Coordination: Coordination  normal.  Psychiatric:        Behavior: Behavior normal. Behavior is cooperative.        Thought Content: Thought content normal.        Judgment: Judgment normal.          Patient has been counseled extensively about nutrition and exercise as well as the importance of adherence with medications and regular follow-up. The patient was given clear instructions to go to ER or return to medical center if symptoms don't improve, worsen or new problems develop. The patient verbalized understanding.   Follow-up: Return in about 3 months (around 09/18/2022).  [name], FNP-BC [facility] and [facility] [employer], [place]   06/18/2022, 10:04 AM

## 2022-06-18 NOTE — Patient Instructions (Addendum)
Blood Builder for anemia FERGON is what I have sent today that you may can find Over the counter Floradix is liquid iron available at most whole food/herbal stores/[employer]

## 2022-06-19 LAB — CMP14+EGFR
ALT: 10 IU/L (ref 0–32)
AST: 18 IU/L (ref 0–40)
Albumin/Globulin Ratio: 1.2 (ref 1.2–2.2)
Albumin: 3.9 g/dL (ref 3.9–4.9)
Alkaline Phosphatase: 72 IU/L (ref 44–121)
BUN/Creatinine Ratio: 6 — ABNORMAL LOW (ref 9–23)
BUN: 5 mg/dL — ABNORMAL LOW (ref 6–24)
Bilirubin Total: <0.2 mg/dL (ref 0.0–1.2)
CO2: 22 mmol/L (ref 20–29)
Calcium: 9.4 mg/dL (ref 8.7–10.2)
Chloride: 105 mmol/L (ref 96–106)
Creatinine, Ser: 0.78 mg/dL (ref 0.57–1.00)
Globulin, Total: 3.2 g/dL (ref 1.5–4.5)
Glucose: 89 mg/dL (ref 70–99)
Potassium: 4.3 mmol/L (ref 3.5–5.2)
Sodium: 138 mmol/L (ref 134–144)
Total Protein: 7.1 g/dL (ref 6.0–8.5)
eGFR: 96 mL/min/{1.73_m2} (ref >59)

## 2022-06-19 LAB — IRON,TIBC AND FERRITIN PANEL
Ferritin: 4 ng/mL — ABNORMAL LOW (ref 15–150)
Iron Saturation: 5 % — CL (ref 15–55)
Iron: 19 ug/dL — ABNORMAL LOW (ref 27–159)
Total Iron Binding Capacity: 399 ug/dL (ref 250–450)
UIBC: 380 ug/dL (ref 131–425)

## 2022-06-19 LAB — CBC WITH DIFFERENTIAL/PLATELET
Basophils Absolute: 0 10*3/uL (ref 0.0–0.2)
Basos: 1 %
EOS (ABSOLUTE): 0 10*3/uL (ref 0.0–0.4)
Eos: 1 %
Hematocrit: 33.8 % — ABNORMAL LOW (ref 34.0–46.6)
Hemoglobin: 10.1 g/dL — ABNORMAL LOW (ref 11.1–15.9)
Immature Grans (Abs): 0 10*3/uL (ref 0.0–0.1)
Immature Granulocytes: 0 %
Lymphocytes Absolute: 1.9 10*3/uL (ref 0.7–3.1)
Lymphs: 43 %
MCH: 22.3 pg — ABNORMAL LOW (ref 26.6–33.0)
MCHC: 29.9 g/dL — ABNORMAL LOW (ref 31.5–35.7)
MCV: 75 fL — ABNORMAL LOW (ref 79–97)
Monocytes Absolute: 0.4 10*3/uL (ref 0.1–0.9)
Monocytes: 9 %
Neutrophils Absolute: 2.1 10*3/uL (ref 1.4–7.0)
Neutrophils: 46 %
Platelets: 246 10*3/uL (ref 150–450)
RBC: 4.53 x10E6/uL (ref 3.77–5.28)
RDW: 15.3 % (ref 11.7–15.4)
WBC: 4.4 10*3/uL (ref 3.4–10.8)

## 2022-06-19 LAB — HEMOGLOBIN A1C
Est. average glucose Bld gHb Est-mCnc: 126 mg/dL
Hgb A1c MFr Bld: 6 % — ABNORMAL HIGH (ref 4.8–5.6)

## 2022-06-22 ENCOUNTER — Ambulatory Visit
Admission: RE | Admit: 2022-06-22 | Discharge: 2022-06-22 | Disposition: A | Discharge status: Home / Self Care | Payer: Commercial Managed Care - HMO | Source: Ambulatory Visit | Attending: Nurse Practitioner | Admitting: Nurse Practitioner

## 2022-06-22 DIAGNOSIS — Z1231 Encounter for screening mammogram for malignant neoplasm of breast: Secondary | ICD-10-CM

## 2022-09-21 ENCOUNTER — Ambulatory Visit: Payer: Commercial Managed Care - HMO | Admitting: Nurse Practitioner

## 2022-11-02 ENCOUNTER — Ambulatory Visit: Payer: 59 | Attending: Nurse Practitioner | Admitting: Nurse Practitioner

## 2022-11-02 ENCOUNTER — Encounter: Payer: Self-pay | Admitting: Nurse Practitioner

## 2022-11-02 VITALS — BP 129/80 | HR 68 | Ht 62.0 in | Wt 169.2 lb

## 2022-11-02 DIAGNOSIS — D500 Iron deficiency anemia secondary to blood loss (chronic): Secondary | ICD-10-CM

## 2022-11-02 DIAGNOSIS — I10 Essential (primary) hypertension: Secondary | ICD-10-CM

## 2022-11-02 DIAGNOSIS — Z862 Personal history of diseases of the blood and blood-forming organs and certain disorders involving the immune mechanism: Secondary | ICD-10-CM

## 2022-11-02 DIAGNOSIS — Z1211 Encounter for screening for malignant neoplasm of colon: Secondary | ICD-10-CM

## 2022-11-02 NOTE — Progress Notes (Signed)
Assessment & Plan:  Kavita was seen today for hypertension and medication refill.  Diagnoses and all orders for this visit:  Essential hypertension -     CMP14+EGFR Continue all antihypertensives as prescribed.  Reminded to bring in blood pressure log for follow  up appointment.  RECOMMENDATIONS: DASH/Mediterranean Diets are healthier choices for HTN.    Colon cancer screening -     Ambulatory referral to Gastroenterology  History of anemia -     CBC with Differential -     Iron, TIBC and Ferritin Panel    Patient has been counseled on age-appropriate routine health concerns for screening and prevention. These are reviewed and up-to-date. Referrals have been placed accordingly. Immunizations are up-to-date or declined.    Subjective:   Chief Complaint  Patient presents with   Hypertension   Medication Refill   HPI Meagan Padilla 47 y.o. female presents to office today for follow up to HTN.  She has a PMH of HTN, anemia, cardiac murmur   HTN Blood pressure is well controlled. She is taking teneretic 50-25 mg  1.5 tablet daily.  BP Readings from Last 3 Encounters:  11/02/22 129/80  06/18/22 (!) 149/93  04/12/22 126/83    Anemia Has not been taking iron tablets daily as prescribed. We will check levels today.    Review of Systems  Constitutional:  Negative for fever, malaise/fatigue and weight loss.  HENT: Negative.  Negative for nosebleeds.   Eyes: Negative.  Negative for blurred vision, double vision and photophobia.  Respiratory: Negative.  Negative for cough and shortness of breath.   Cardiovascular: Negative.  Negative for chest pain, palpitations and leg swelling.  Gastrointestinal: Negative.  Negative for heartburn, nausea and vomiting.  Musculoskeletal: Negative.  Negative for myalgias.  Neurological: Negative.  Negative for dizziness, focal weakness, seizures and headaches.  Psychiatric/Behavioral: Negative.  Negative for suicidal ideas.     Past  Medical History:  Diagnosis Date   Hypertension    Murmur, cardiac     Past Surgical History:  Procedure Laterality Date   DENTAL SURGERY      Family History  Problem Relation Age of Onset   Hypertension Mother    Hypertension Father    Hypertension Brother     Social History Reviewed with no changes to be made today.   Outpatient Medications Prior to Visit  Medication Sig Dispense Refill   acetaminophen (TYLENOL) 500 MG tablet Take 500 mg by mouth every 6 (six) hours as needed for headache.      atenolol-chlorthalidone (TENORETIC) 50-25 MG tablet Take 1.5 tablets by mouth daily. 135 tablet 1   ferrous gluconate (FERGON) 324 MG tablet Take 1 tablet (324 mg total) by mouth daily with breakfast. 30 tablet 3   fluticasone (FLONASE) 50 MCG/ACT nasal spray Place 1 spray into both nostrils daily. 16 g 0   furosemide (LASIX) 20 MG tablet Take 0.5 tablets (10 mg total) by mouth daily. 45 tablet 1   dicyclomine (BENTYL) 20 MG tablet Take 1 tablet (20 mg total) by mouth 2 (two) times daily as needed for spasms. 20 tablet 0   methocarbamol (ROBAXIN) 500 MG tablet Take 1 tablet (500 mg total) by mouth 2 (two) times daily. 20 tablet 0   Misc. Devices MISC Please provide patient with custom fitted compression stockings I89.0 1 each 0   ondansetron (ZOFRAN ODT) 4 MG disintegrating tablet Take 1 tablet (4 mg total) by mouth every 8 (eight) hours as needed for nausea or vomiting.  20 tablet 0   No facility-administered medications prior to visit.    No Known Allergies     Objective:    BP 129/80   Pulse 68   Ht '5\' 2"'$  (1.575 m)   Wt 169 lb 3.2 oz (76.7 kg)   SpO2 99%   BMI 30.95 kg/m  Wt Readings from Last 3 Encounters:  11/02/22 169 lb 3.2 oz (76.7 kg)  06/18/22 163 lb 6.4 oz (74.1 kg)  04/12/22 166 lb 3.2 oz (75.4 kg)    Physical Exam Vitals and nursing note reviewed.  Constitutional:      Appearance: She is well-developed.  HENT:     Head: Normocephalic and atraumatic.   Cardiovascular:     Rate and Rhythm: Normal rate and regular rhythm.     Heart sounds: Murmur heard.     No friction rub. No gallop.  Pulmonary:     Effort: Pulmonary effort is normal. No tachypnea or respiratory distress.     Breath sounds: Normal breath sounds. No decreased breath sounds, wheezing, rhonchi or rales.  Chest:     Chest wall: No tenderness.  Abdominal:     General: Bowel sounds are normal.     Palpations: Abdomen is soft.  Musculoskeletal:        General: Normal range of motion.     Cervical back: Normal range of motion.  Skin:    General: Skin is warm and dry.  Neurological:     Mental Status: She is alert and oriented to person, place, and time.     Coordination: Coordination normal.  Psychiatric:        Behavior: Behavior normal. Behavior is cooperative.        Thought Content: Thought content normal.        Judgment: Judgment normal.          Patient has been counseled extensively about nutrition and exercise as well as the importance of adherence with medications and regular follow-up. The patient was given clear instructions to go to ER or return to medical center if symptoms don't improve, worsen or new problems develop. The patient verbalized understanding.   Follow-up: Return in about 3 months (around 02/01/2023).   Meagan Pounds, FNP-BC [facility] and [facility], [place]   11/08/2022, 8:42 PM

## 2022-11-03 LAB — CBC WITH DIFFERENTIAL/PLATELET
Basophils Absolute: 0.1 10*3/uL (ref 0.0–0.2)
Basos: 1 %
EOS (ABSOLUTE): 0.1 10*3/uL (ref 0.0–0.4)
Eos: 1 %
Hematocrit: 34.4 % (ref 34.0–46.6)
Hemoglobin: 10.7 g/dL — ABNORMAL LOW (ref 11.1–15.9)
Immature Grans (Abs): 0 10*3/uL (ref 0.0–0.1)
Immature Granulocytes: 0 %
Lymphocytes Absolute: 2.8 10*3/uL (ref 0.7–3.1)
Lymphs: 41 %
MCH: 23.4 pg — ABNORMAL LOW (ref 26.6–33.0)
MCHC: 31.1 g/dL — ABNORMAL LOW (ref 31.5–35.7)
MCV: 75 fL — ABNORMAL LOW (ref 79–97)
Monocytes Absolute: 0.6 10*3/uL (ref 0.1–0.9)
Monocytes: 8 %
Neutrophils Absolute: 3.4 10*3/uL (ref 1.4–7.0)
Neutrophils: 49 %
Platelets: 277 10*3/uL (ref 150–450)
RBC: 4.58 x10E6/uL (ref 3.77–5.28)
RDW: 16.4 % — ABNORMAL HIGH (ref 11.7–15.4)
WBC: 6.9 10*3/uL (ref 3.4–10.8)

## 2022-11-03 LAB — CMP14+EGFR
ALT: 11 IU/L (ref 0–32)
AST: 22 IU/L (ref 0–40)
Albumin/Globulin Ratio: 1.3 (ref 1.2–2.2)
Albumin: 4.1 g/dL (ref 3.9–4.9)
Alkaline Phosphatase: 92 IU/L (ref 44–121)
BUN/Creatinine Ratio: 10 (ref 9–23)
BUN: 8 mg/dL (ref 6–24)
Bilirubin Total: <0.2 mg/dL (ref 0.0–1.2)
CO2: 28 mmol/L (ref 20–29)
Calcium: 9.8 mg/dL (ref 8.7–10.2)
Chloride: 100 mmol/L (ref 96–106)
Creatinine, Ser: 0.82 mg/dL (ref 0.57–1.00)
Globulin, Total: 3.1 g/dL (ref 1.5–4.5)
Glucose: 78 mg/dL (ref 70–99)
Potassium: 4.4 mmol/L (ref 3.5–5.2)
Sodium: 138 mmol/L (ref 134–144)
Total Protein: 7.2 g/dL (ref 6.0–8.5)
eGFR: 90 mL/min/{1.73_m2} (ref >59)

## 2022-11-03 LAB — IRON,TIBC AND FERRITIN PANEL
Ferritin: 5 ng/mL — ABNORMAL LOW (ref 15–150)
Iron Saturation: 5 % — CL (ref 15–55)
Iron: 23 ug/dL — ABNORMAL LOW (ref 27–159)
Total Iron Binding Capacity: 432 ug/dL (ref 250–450)
UIBC: 409 ug/dL (ref 131–425)

## 2022-11-06 ENCOUNTER — Encounter: Payer: Self-pay | Admitting: Nurse Practitioner

## 2022-11-08 ENCOUNTER — Encounter: Payer: Self-pay | Admitting: Nurse Practitioner

## 2022-11-08 MED ORDER — ATENOLOL-CHLORTHALIDONE 50-25 MG PO TABS: 1.5000 | ORAL_TABLET | Freq: Every day | ORAL | 1 refills | Status: DC

## 2022-11-23 ENCOUNTER — Other Ambulatory Visit: Payer: Self-pay | Admitting: Nurse Practitioner

## 2022-11-23 ENCOUNTER — Ambulatory Visit
Admission: RE | Admit: 2022-11-23 | Discharge: 2022-11-23 | Disposition: A | Discharge status: Home / Self Care | Payer: 59 | Source: Ambulatory Visit | Attending: Nurse Practitioner | Admitting: Nurse Practitioner

## 2022-11-23 DIAGNOSIS — D500 Iron deficiency anemia secondary to blood loss (chronic): Secondary | ICD-10-CM

## 2022-11-23 DIAGNOSIS — D219 Benign neoplasm of connective and other soft tissue, unspecified: Secondary | ICD-10-CM

## 2022-11-27 ENCOUNTER — Encounter: Payer: Self-pay | Admitting: General Practice

## 2022-11-27 ENCOUNTER — Encounter: Payer: Self-pay | Admitting: Nurse Practitioner

## 2023-01-09 ENCOUNTER — Encounter: Payer: Self-pay | Admitting: Obstetrics and Gynecology

## 2023-01-09 ENCOUNTER — Ambulatory Visit: Payer: 59 | Admitting: Obstetrics and Gynecology

## 2023-01-09 VITALS — BP 137/71 | HR 86 | Ht 62.0 in | Wt 170.0 lb

## 2023-01-09 DIAGNOSIS — N939 Abnormal uterine and vaginal bleeding, unspecified: Secondary | ICD-10-CM

## 2023-01-09 NOTE — Progress Notes (Signed)
Imaging done on 11/23/22 revealed two uterine leiomyomata. [facility]. Kathrene Alu RN

## 2023-01-09 NOTE — Patient Instructions (Addendum)
Heavy menstrual bleeding can be treated with medication or surgery.  Medication includes different hormonal and non-hormonal medication - pills, nexplanon (may have irregular), depo (may have irregular bleeding, lysteda (non-hormonal medication), progesterone IUD (may have irregular bleeding to start then may not have menses at all)  Surgical procedures include: sonata, uterine fibroid embolization (not an option if you want to get pregnant), endometrial ablation (not an option if you want to get pregnant), myomectomy (removal of fibroids), and hysterectomy (removal of uterus)    Sonata: [email or web address]

## 2023-01-09 NOTE — Progress Notes (Signed)
NEW GYNECOLOGY PATIENT Patient name: Meagan Padilla MRN [id number]  Date of birth: 07/06/1976 Chief Complaint:   Fibroids     History:  Meagan Padilla is a 47 y.o. being seen today for heavy menstrual periods for life. 2nd and 3rd day are the heaviest, total of 5 days No prior medication for her menses. No pain associated with the bleeding. Pads, typically using 3-4 pads in a day, overnight and will mostly fill them up. Occasional gushes of blood loss are felt Prior pregnancy: none Not thinking of getting pregnancy soon but considering it, would like to keep the option Not currently sexually active No issues with voiding and BM No significant weight changes No breast or nipple changes  CHC contraindication: HTN on med        Gynecologic History Patient's last menstrual period was 12/18/2022 (within days). Contraception: none Last Pap:     Component Value Date/Time   DIAGPAP  07/04/2021 1106    - Negative for intraepithelial lesion or malignancy (NILM)   HPVHIGH Negative 07/04/2021 1106   ADEQPAP  07/04/2021 1106    Satisfactory for evaluation; transformation zone component PRESENT.    Last Mammogram: 2023. BIRADS 1 Last Colonoscopy: N/A  Obstetric History OB History  No obstetric history on file.    Past Medical History:  Diagnosis Date   Hypertension    Murmur, cardiac     Past Surgical History:  Procedure Laterality Date   DENTAL SURGERY      Current Outpatient Medications on File Prior to Visit  Medication Sig Dispense Refill   acetaminophen (TYLENOL) 500 MG tablet Take 500 mg by mouth every 6 (six) hours as needed for headache.      atenolol-chlorthalidone (TENORETIC) 50-25 MG tablet Take 1.5 tablets by mouth daily. 135 tablet 1   ferrous gluconate (FERGON) 324 MG tablet Take 1 tablet (324 mg total) by mouth daily with breakfast. 30 tablet 3   fluticasone (FLONASE) 50 MCG/ACT nasal spray Place 1 spray into both nostrils daily. 16 g 0   furosemide  (LASIX) 20 MG tablet Take 0.5 tablets (10 mg total) by mouth daily. 45 tablet 1   No current facility-administered medications on file prior to visit.    No Known Allergies  Social History:  reports that she has never smoked. She has never used smokeless tobacco. She reports that she does not drink alcohol and does not use drugs.  Family History  Problem Relation Age of Onset   Hypertension Mother    Hypertension Father    Hypertension Brother     The following portions of the patient's history were reviewed and updated as appropriate: allergies, current medications, past family history, past medical history, past social history, past surgical history and problem list.  Review of Systems Pertinent items noted in HPI and remainder of comprehensive ROS otherwise negative.  Physical Exam:  BP 137/71   Pulse 86   Ht 5\' 2"  (1.575 m)   Wt 170 lb (77.1 kg)   LMP 12/18/2022 (Within Days)   BMI 31.09 kg/m  Physical Exam Vitals and nursing note reviewed. Exam conducted with a chaperone present.  Constitutional:      Appearance: Normal appearance.  Cardiovascular:     Rate and Rhythm: Normal rate.  Pulmonary:     Effort: Pulmonary effort is normal.     Breath sounds: Normal breath sounds.  Genitourinary:    Exam position: Lithotomy position.     Comments: Enlarged, mobile uterus, smooth contour, nontender Posterior  cervix   Neurological:     General: No focal deficit present.     Mental Status: She is alert and oriented to person, place, and time.  Psychiatric:        Mood and Affect: Mood normal.        Behavior: Behavior normal.        Thought Content: Thought content normal.        Judgment: Judgment normal.    US PELVIC COMPLETE WITH TRANSVAGINAL CLINICAL DATA:  Iron deficiency anemia, menorrhagia, LMP 11/19/2022  EXAM: TRANSABDOMINAL AND TRANSVAGINAL ULTRASOUND OF PELVIS  TECHNIQUE: Both transabdominal and transvaginal ultrasound examinations of the pelvis were  performed. Transabdominal technique was performed for global imaging of the pelvis including uterus, ovaries, adnexal regions, and pelvic cul-de-sac. It was necessary to proceed with endovaginal exam following the transabdominal exam to visualize the endometrium and ovaries.  COMPARISON:  None Available.  FINDINGS: Uterus  Measurements: 11.0 x 7.1 x 7.3 cm = volume: 300 mL. Anteverted. Heterogeneous myometrium. Large leiomyoma at upper uterine segment anteriorly, subserosal, 5.9 x 5.6 x 5.5 cm, does not definitely extend submucosal. Smaller intramural leiomyoma RIGHT lateral uterus 2.8 x 2.6 x 1.8 cm.  Endometrium  Thickness: 9 mm.  No endometrial fluid or mass  Right ovary  Measurements: 3.5 x 3.9 x 2.2 cm = volume: 15 mL. Hemorrhagic cyst RIGHT ovary 3.7 cm diameter; no follow-up imaging recommended  Left ovary  Measurements: 2.7 x 1.7 x 2.3 cm = volume: 5.5 mL. Normal morphology without mass  Other findings  No free pelvic fluid or adnexal masses.  IMPRESSION: Two uterine leiomyomata, largest 5.9 cm diameter at upper uterine segment subserosal.  Remainder of exam unremarkable.  Electronically Signed   By: Lavonia Dana M.D.   On: 11/23/2022 17:24     Assessment and Plan:   1. Abnormal uterine bleeding (AUB) Patient has abnormal uterine bleeding. Enlarged uterus with US demonstrating fibroids. Demonstrated anemia on CBC. Discussed management options for abnormal uterine bleeding including NSAIDs (Naproxen), tranexamic acid (Lysteda), oral progesterone, Depo Provera, Levonogestrel IUD, endometrial ablation or hysterectomy as definitive surgical management.  Discussed risks and benefits of each method. Patient would like to consider her options.  Reviewed options that were most and least compatible with future fertility. Patient considering sonata procedure and would like to read more.   Please refer to After Visit Summary for other counseling recommendations.    Follow-up: Return in about 4 weeks (around 02/06/2023).      Darliss Cheney, MD Obstetrician & Gynecologist, Faculty Practice [facility] for [employer], [place]

## 2023-02-08 ENCOUNTER — Ambulatory Visit: Payer: 59 | Admitting: Obstetrics and Gynecology

## 2023-02-08 ENCOUNTER — Encounter: Payer: Self-pay | Admitting: Obstetrics and Gynecology

## 2023-02-08 VITALS — BP 132/81 | HR 78 | Ht 62.0 in | Wt 162.0 lb

## 2023-02-08 DIAGNOSIS — N939 Abnormal uterine and vaginal bleeding, unspecified: Secondary | ICD-10-CM | POA: Diagnosis not present

## 2023-02-08 MED ORDER — TRANEXAMIC ACID 650 MG PO TABS: 1300.0000 mg | ORAL_TABLET | Freq: Three times a day (TID) | ORAL | 2 refills | Status: DC

## 2023-02-08 NOTE — Progress Notes (Signed)
Patient presents to discuss management options. Patient thinks she would like to start with medication option.Armandina Stammer RN

## 2023-02-08 NOTE — Progress Notes (Signed)
    GYNECOLOGY VISIT  Patient name: Meagan Padilla MRN [id number]  Date of birth: 14-Jul-1976 Chief Complaint:   Menstrual Problem  History:  Meagan Padilla is a 47 y.o. No obstetric history on file. being seen today for AUB follow up. Has looked into the procedure and would prefer to start with medication if able. Menses are typically 5 days sometimes light bleeding into 6 or 7 days. .   No hx of VTE Concerned about possible interaction with BP medication  Past Medical History:  Diagnosis Date   Hypertension    Murmur, cardiac     Past Surgical History:  Procedure Laterality Date   DENTAL SURGERY      The following portions of the patient's history were reviewed and updated as appropriate: allergies, current medications, past family history, past medical history, past social history, past surgical history and problem list.   Health Maintenance:   Last pap     Component Value Date/Time   DIAGPAP  07/04/2021 1106    - Negative for intraepithelial lesion or malignancy (NILM)   HPVHIGH Negative 07/04/2021 1106   ADEQPAP  07/04/2021 1106    Satisfactory for evaluation; transformation zone component PRESENT.     Review of Systems:  Pertinent items are noted in HPI. Comprehensive review of systems was otherwise negative.   Objective:  Physical Exam BP 132/81   Pulse 78   Ht  (1.575 m)   Wt 162 lb (73.5 kg)   LMP 12/18/2022 (Within Days)   BMI 29.63 kg/m    Physical Exam Vitals and nursing note reviewed.  Constitutional:      Appearance: Normal appearance.  HENT:     Head: Normocephalic and atraumatic.  Pulmonary:     Effort: Pulmonary effort is normal.  Skin:    General: Skin is warm and dry.  Neurological:     General: No focal deficit present.     Mental Status: She is alert.  Psychiatric:        Mood and Affect: Mood normal.        Behavior: Behavior normal.        Thought Content: Thought content normal.        Judgment: Judgment normal.        Assessment & Plan:   1. Abnormal uterine bleeding (AUB) Reviewed different medication options and she would like to trial TXA. Follow up response to meds in 2-3 months  - tranexamic acid (LYSTEDA) 650 MG TABS tablet; Take 2 tablets (1,300 mg total) by mouth 3 (three) times daily. Take during menses for a maximum of five days  Dispense: 30 tablet; Refill: 2   Lorriane Shire, MD [facility] for [facility], [facility]

## 2023-02-16 ENCOUNTER — Telehealth: Payer: Self-pay | Admitting: Family Medicine

## 2023-02-16 DIAGNOSIS — B9689 Other specified bacterial agents as the cause of diseases classified elsewhere: Secondary | ICD-10-CM

## 2023-02-16 DIAGNOSIS — J019 Acute sinusitis, unspecified: Secondary | ICD-10-CM

## 2023-02-16 MED ORDER — AMOXICILLIN-POT CLAVULANATE 875-125 MG PO TABS: 1.0000 | ORAL_TABLET | Freq: Two times a day (BID) | ORAL | 0 refills | Status: AC

## 2023-02-16 NOTE — Patient Instructions (Signed)

## 2023-02-16 NOTE — Progress Notes (Signed)
Virtual Visit Consent   Meagan Padilla, you are scheduled for a virtual visit with a [facility] provider today. Just as with appointments in the office, your consent must be obtained to participate. Your consent will be active for this visit and any virtual visit you may have with one of our providers in the next 365 days. If you have a MyChart account, a copy of this consent can be sent to you electronically.  As this is a virtual visit, video technology does not allow for your provider to perform a traditional examination. This may limit your provider's ability to fully assess your condition. If your provider identifies any concerns that need to be evaluated in person or the need to arrange testing (such as labs, EKG, etc.), we will make arrangements to do so. Although advances in technology are sophisticated, we cannot ensure that it will always work on either your end or our end. If the connection with a video visit is poor, the visit may have to be switched to a telephone visit. With either a video or telephone visit, we are not always able to ensure that we have a secure connection.  By engaging in this virtual visit, you consent to the provision of healthcare and authorize for your insurance to be billed (if applicable) for the services provided during this visit. Depending on your insurance coverage, you may receive a charge related to this service.  I need to obtain your verbal consent now. Are you willing to proceed with your visit today? Meagan Padilla has provided verbal consent on 02/16/2023 for a virtual visit (video or telephone). Georgana Curio, FNP  Date: 02/16/2023 1:31 PM  Virtual Visit via Video Note   I, Georgana Curio, connected with  Meagan Padilla  ([id number], [birth year]) on 02/16/23 at  1:30 PM EDT by a video-enabled telemedicine application and verified that I am speaking with the correct person using two identifiers.  Location: Patient: Virtual Visit Location Patient:  Home Provider: Virtual Visit Location Provider: Home Office   I discussed the limitations of evaluation and management by telemedicine and the availability of in person appointments. The patient expressed understanding and agreed to proceed.    History of Present Illness: Meagan Padilla is a 47 y.o. who identifies as a female who was assigned female at birth, and is being seen today for sinus pain and pressure, cough, thick green mucus, no fever, for a week worsening. [name]  HPI: HPI  Problems:  Patient Active Problem List   Diagnosis Date Noted   Musculoskeletal pain 04/05/2022    Allergies: No Known Allergies Medications:  Current Outpatient Medications:    acetaminophen (TYLENOL) 500 MG tablet, Take 500 mg by mouth every 6 (six) hours as needed for headache. , Disp: , Rfl:    atenolol-chlorthalidone (TENORETIC) 50-25 MG tablet, Take 1.5 tablets by mouth daily., Disp: 135 tablet, Rfl: 1   ferrous gluconate (FERGON) 324 MG tablet, Take 1 tablet (324 mg total) by mouth daily with breakfast., Disp: 30 tablet, Rfl: 3   fluticasone (FLONASE) 50 MCG/ACT nasal spray, Place 1 spray into both nostrils daily., Disp: 16 g, Rfl: 0   furosemide (LASIX) 20 MG tablet, Take 0.5 tablets (10 mg total) by mouth daily., Disp: 45 tablet, Rfl: 1   tranexamic acid (LYSTEDA) 650 MG TABS tablet, Take 2 tablets (1,300 mg total) by mouth 3 (three) times daily. Take during menses for a maximum of five days, Disp: 30 tablet, Rfl: 2  Observations/Objective: Patient  is well-developed, well-nourished in no acute distress.  Resting comfortably  at home.  Head is normocephalic, atraumatic.  No labored breathing.  Speech is clear and coherent with logical content.  Patient is alert and oriented at baseline.   Assessment and Plan: 1. Acute bacterial sinusitis  Increase fluids humidifier at night, ibuprofen as directed, flonase and allergy meds, UC if sx worsen.   Follow Up Instructions: I discussed the assessment  and treatment plan with the patient. The patient was provided an opportunity to ask questions and all were answered. The patient agreed with the plan and demonstrated an understanding of the instructions.  A copy of instructions were sent to the patient via MyChart unless otherwise noted below.     The patient was advised to call back or seek an in-person evaluation if the symptoms worsen or if the condition fails to improve as anticipated.  Time:  I spent 10 minutes with the patient via telehealth technology discussing the above problems/concerns.    Georgana Curio, FNP

## 2023-04-11 NOTE — Progress Notes (Deleted)
    GYNECOLOGY VISIT  Patient name: Meagan Padilla MRN [id number]  Date of birth: Dec 07, 1975 Chief Complaint:   No chief complaint on file.   History:  Meagan Padilla is a 47 y.o. No obstetric history on file. being seen today for ***.    Las seen 01/2023 and opted to trial TXA for menstrual managment  Past Medical History:  Diagnosis Date   Hypertension    Murmur, cardiac     Past Surgical History:  Procedure Laterality Date   DENTAL SURGERY      The following portions of the patient's history were reviewed and updated as appropriate: allergies, current medications, past family history, past medical history, past social history, past surgical history and problem list.   Health Maintenance:   Last pap ***. Results were: {Pap findings:25134}. H/O abnormal pap: {yes/yes***/no:23866} Last mammogram: ***. Results were: {normal, abnormal, n/a:23837}. Family h/o breast cancer: {yes***/no:23838}   Review of Systems:  {Ros - complete:30496} Comprehensive review of systems was otherwise negative.   Objective:  Physical Exam There were no vitals taken for this visit.   Physical Exam   Labs and Imaging No results found.     Assessment & Plan:   There are no diagnoses linked to this encounter.   *** Routine preventative health maintenance measures emphasized.  Lorriane Shire, MD [facility] for [facility], [facility]

## 2023-04-12 ENCOUNTER — Ambulatory Visit: Payer: 59 | Admitting: Obstetrics and Gynecology

## 2023-04-19 ENCOUNTER — Encounter: Payer: Self-pay | Admitting: Obstetrics and Gynecology

## 2023-04-19 ENCOUNTER — Ambulatory Visit: Payer: 59 | Admitting: Obstetrics and Gynecology

## 2023-04-19 VITALS — BP 119/75 | HR 71 | Wt 165.0 lb

## 2023-04-19 DIAGNOSIS — N939 Abnormal uterine and vaginal bleeding, unspecified: Secondary | ICD-10-CM

## 2023-04-19 NOTE — Progress Notes (Signed)
    GYNECOLOGY VISIT  Patient name: Meagan Padilla MRN [id number]  Date of birth: [birth year] Chief Complaint:   Menstrual Problem  History:  Meagan Padilla is a 47 y.o. No obstetric history on file. being seen today for AUB. Has been doing well with TXA. Started in the midst of a menses and it lightened and subsequent menses also much lighter, remains 5 days but not nearly as heavy and she is very pleased with the result.     Past Medical History:  Diagnosis Date   Hypertension    Murmur, cardiac     Past Surgical History:  Procedure Laterality Date   DENTAL SURGERY      The following portions of the patient's history were reviewed and updated as appropriate: allergies, current medications, past family history, past medical history, past social history, past surgical history and problem list.   Review of Systems:  Pertinent items are noted in HPI. Comprehensive review of systems was otherwise negative.   Objective:  Physical Exam BP 119/75   Pulse 71   Wt 165 lb (74.8 kg)   LMP 03/25/2023 (Approximate)   BMI 30.18 kg/m    Physical Exam Vitals and nursing note reviewed.  Constitutional:      Appearance: Normal appearance.  HENT:     Head: Normocephalic and atraumatic.  Pulmonary:     Effort: Pulmonary effort is normal.  Skin:    General: Skin is warm and dry.  Neurological:     General: No focal deficit present.     Mental Status: She is alert.  Psychiatric:        Mood and Affect: Mood normal.        Behavior: Behavior normal.        Thought Content: Thought content normal.        Judgment: Judgment normal.        Assessment & Plan:   1. Abnormal uterine bleeding (AUB) Doing well with TXA and seeing much improvement in flow. Will continue and re-asess in 6 months.    Routine preventative health maintenance measures emphasized.  Lorriane Shire, MD [facility] for [facility], [facility]

## 2023-07-12 ENCOUNTER — Other Ambulatory Visit: Payer: Self-pay | Admitting: Nurse Practitioner

## 2023-07-12 DIAGNOSIS — R600 Localized edema: Secondary | ICD-10-CM

## 2023-07-26 ENCOUNTER — Other Ambulatory Visit: Payer: Self-pay | Admitting: Family Medicine

## 2023-07-26 DIAGNOSIS — R600 Localized edema: Secondary | ICD-10-CM

## 2023-07-26 NOTE — Telephone Encounter (Signed)
Requested medication (s) are due for refill today: yes  Requested medication (s) are on the active medication list: yes  Last refill:  07/12/23  Future visit scheduled: no  Notes to clinic:  Unable to refill per protocol due to failed labs, no updated results.      Requested Prescriptions  Pending Prescriptions Disp Refills   furosemide (LASIX) 20 MG tablet [Pharmacy Med Name: FUROSEMIDE 20 MG TABLET] 15 tablet 0    Sig: TAKE 1/2 TABLET BY MOUTH DAILY     Cardiovascular:  Diuretics - Loop Failed - 07/26/2023 11:53 AM      Failed - K in normal range and within 180 days    Potassium  Date Value Ref Range Status  11/02/2022 4.4 3.5 - 5.2 mmol/L Final         Failed - Ca in normal range and within 180 days    Calcium  Date Value Ref Range Status  11/02/2022 9.8 8.7 - 10.2 mg/dL Final         Failed - Na in normal range and within 180 days    Sodium  Date Value Ref Range Status  11/02/2022 138 134 - 144 mmol/L Final         Failed - Cr in normal range and within 180 days    Creatinine, Ser  Date Value Ref Range Status  11/02/2022 0.82 0.57 - 1.00 mg/dL Final         Failed - Cl in normal range and within 180 days    Chloride  Date Value Ref Range Status  11/02/2022 100 96 - 106 mmol/L Final         Failed - Mg Level in normal range and within 180 days    No results found for: "MG"       Failed - Valid encounter within last 6 months    Recent Outpatient Visits           8 months ago Essential hypertension   [facility] [place], Shea Stakes, NP   1 year ago Essential hypertension   [place] [facility] [place], Shea Stakes, NP   1 year ago Anemia, unspecified type   [facility] [place], Marzella Schlein, [place]   1 year ago Essential hypertension   [place] [facility] [place], Shea Stakes, NP   1 year ago Essential hypertension   [facility]  [facility] [place], [place] W, NP              Passed - Last BP in normal range    BP Readings from Last 1 Encounters:  04/19/23 119/75

## 2023-09-26 ENCOUNTER — Telehealth: Payer: Self-pay

## 2023-09-26 NOTE — Telephone Encounter (Signed)
Called patient to schedule a 6 month follow up appointment with Dr. Briscoe Deutscher. Left a voicemail for her to call back and schedule.

## 2023-11-13 NOTE — Progress Notes (Signed)
    GYNECOLOGY VIRTUAL VISIT ENCOUNTER NOTE  Provider location: Center for [facility]   Patient location: Home  I connected with Meagan Padilla on 11/13/23 at  9:15 AM EST by MyChart Video Encounter and verified that I am speaking with the correct person using two identifiers.   I discussed the limitations, risks, security and privacy concerns of performing an evaluation and management service virtually and the availability of in person appointments. I also discussed with the patient that there may be a patient responsible charge related to this service. The patient expressed understanding and agreed to proceed.   History:  Meagan Padilla is a 48 y.o. No obstetric history on file. female being evaluated today for AUB follow up. Just had a cycle about 1 week ago and took it and it has helped with the flow of the menses. No side effects. May have a day or 2 .   Last visit 04/19/23: AUB treated with TXA     Past Medical History:  Diagnosis Date   Hypertension    Murmur, cardiac    Past Surgical History:  Procedure Laterality Date   DENTAL SURGERY     The following portions of the patient's history were reviewed and updated as appropriate: allergies, current medications, past family history, past medical history, past social history, past surgical history and problem list.   Health Maintenance:      Component Value Date/Time   DIAGPAP  07/04/2021 1106    - Negative for intraepithelial lesion or malignancy (NILM)   HPVHIGH Negative 07/04/2021 1106   ADEQPAP  07/04/2021 1106    Satisfactory for evaluation; transformation zone component PRESENT.    High Risk HPV: Positive  Adequacy:  Satisfactory for evaluation, transformation zone component PRESENT  Diagnosis:  Atypical squamous cells of undetermined significance (ASC-US)   Review of Systems:  Pertinent items noted in HPI and remainder of comprehensive ROS otherwise negative.  Physical  Exam:   General:  Alert, oriented and cooperative. Patient appears to be in no acute distress.  Mental Status: Normal mood and affect. Normal behavior. Normal judgment and thought content.   Respiratory: Normal respiratory effort, no problems with respiration noted  Rest of physical exam deferred due to type of encounter     Assessment and Plan:     1. Abnormal uterine bleeding (AUB) Improved with TXA and menses are significantly improved with out adverse effects. Will continue. Plan for interval CBC - notes having PCP appointment next month, anticipate that it should be checked with annual visit (future order placed).       I discussed the assessment and treatment plan with the patient. The patient was provided an opportunity to ask questions and all were answered. The patient agreed with the plan and demonstrated an understanding of the instructions.   The patient was advised to call back or seek an in-person evaluation/go to the ED if the symptoms worsen or if the condition fails to improve as anticipated.  I provided 6 minutes of face-to-face time during this encounter.   Lorriane Shire, MD Center for [employer], [facility]

## 2023-11-15 ENCOUNTER — Telehealth (INDEPENDENT_AMBULATORY_CARE_PROVIDER_SITE_OTHER): Payer: Self-pay | Admitting: Obstetrics and Gynecology

## 2023-11-15 DIAGNOSIS — N939 Abnormal uterine and vaginal bleeding, unspecified: Secondary | ICD-10-CM

## 2023-11-15 MED ORDER — TRANEXAMIC ACID 650 MG PO TABS: 1300.0000 mg | ORAL_TABLET | Freq: Three times a day (TID) | ORAL | 12 refills | Status: AC

## 2023-11-15 MED ORDER — TRANEXAMIC ACID 650 MG PO TABS: 1300.0000 mg | ORAL_TABLET | Freq: Three times a day (TID) | ORAL | 2 refills | Status: DC

## 2023-11-25 ENCOUNTER — Ambulatory Visit: Payer: 59 | Attending: Nurse Practitioner | Admitting: Nurse Practitioner

## 2023-11-25 ENCOUNTER — Encounter: Payer: Self-pay | Admitting: Nurse Practitioner

## 2023-11-25 VITALS — BP 129/76 | HR 68 | Resp 19 | Ht 62.0 in | Wt 178.6 lb

## 2023-11-25 DIAGNOSIS — R600 Localized edema: Secondary | ICD-10-CM

## 2023-11-25 DIAGNOSIS — Z23 Encounter for immunization: Secondary | ICD-10-CM | POA: Diagnosis not present

## 2023-11-25 DIAGNOSIS — Z0000 Encounter for general adult medical examination without abnormal findings: Secondary | ICD-10-CM | POA: Diagnosis not present

## 2023-11-25 DIAGNOSIS — D500 Iron deficiency anemia secondary to blood loss (chronic): Secondary | ICD-10-CM

## 2023-11-25 DIAGNOSIS — Z1211 Encounter for screening for malignant neoplasm of colon: Secondary | ICD-10-CM

## 2023-11-25 DIAGNOSIS — E785 Hyperlipidemia, unspecified: Secondary | ICD-10-CM

## 2023-11-25 DIAGNOSIS — R7303 Prediabetes: Secondary | ICD-10-CM | POA: Diagnosis not present

## 2023-11-25 DIAGNOSIS — I10 Essential (primary) hypertension: Secondary | ICD-10-CM

## 2023-11-25 DIAGNOSIS — Z1231 Encounter for screening mammogram for malignant neoplasm of breast: Secondary | ICD-10-CM

## 2023-11-25 MED ORDER — FERROUS GLUCONATE 324 (38 FE) MG PO TABS: 324.0000 mg | ORAL_TABLET | Freq: Every day | ORAL | 3 refills | Status: AC

## 2023-11-25 MED ORDER — FUROSEMIDE 20 MG PO TABS: 10.0000 mg | ORAL_TABLET | Freq: Every day | ORAL | 1 refills | Status: DC

## 2023-11-25 MED ORDER — ATENOLOL-CHLORTHALIDONE 50-25 MG PO TABS: 1.5000 | ORAL_TABLET | Freq: Every day | ORAL | 1 refills | Status: AC

## 2023-11-25 NOTE — Progress Notes (Signed)
Assessment & Plan:  Meagan Padilla was seen today for annual exam.  Diagnoses and all orders for this visit:  Encounter for annual physical exam -     CBC with Differential -     CMP14+EGFR -     Hemoglobin A1c -     Lipid panel -     Iron, TIBC and Ferritin Panel  Encounter for screening mammogram for malignant neoplasm of breast -     MS 3D SCR MAMMO BILAT BR (aka MM); Future  Colon cancer screening -     Ambulatory referral to Gastroenterology  Primary hypertension -     CMP14+EGFR -     atenolol-chlorthalidone (TENORETIC) 50-25 MG tablet; Take 1.5 tablets by mouth daily. Continue all antihypertensives as prescribed.  Reminded to bring in blood pressure log for follow  up appointment.  RECOMMENDATIONS: DASH/Mediterranean Diets are healthier choices for HTN.    Iron deficiency anemia due to chronic blood loss -     CBC with Differential -     Iron, TIBC and Ferritin Panel -     ferrous gluconate (FERGON) 324 MG tablet; Take 1 tablet (324 mg total) by mouth daily with breakfast.  Prediabetes -     CMP14+EGFR -     Hemoglobin A1c  Dyslipidemia, goal LDL below 70 -     Lipid panel INSTRUCTIONS: Work on a low fat, heart healthy diet and participate in regular aerobic exercise program by working out at least 150 minutes per week; 5 days a week-30 minutes per day. Avoid red meat/beef/steak,  fried foods. junk foods, sodas, sugary drinks, unhealthy snacking, alcohol and smoking.  Drink at least 80 oz of water per day and monitor your carbohydrate intake daily.    Bilateral lower extremity edema -     furosemide (LASIX) 20 MG tablet; Take 0.5 tablets (10 mg total) by mouth daily.      Patient has been counseled on age-appropriate routine health concerns for screening and prevention. These are reviewed and up-to-date. Referrals have been placed accordingly. Immunizations are up-to-date or declined.    Subjective:   Chief Complaint  Patient presents with   Annual Exam    Meagan Padilla 48 y.o. female presents to office today for annual physical exam.   She is currently being followed by OB GYN for AUB. Taking lysteda which she notes today has significantly improved her menorrhagia.    Blood pressure is well controlled.  BP Readings from Last 3 Encounters:  11/25/23 129/76  04/19/23 119/75  02/08/23 132/81      Review of Systems  Constitutional:  Negative for fever, malaise/fatigue and weight loss.  HENT: Negative.  Negative for nosebleeds.   Eyes: Negative.  Negative for blurred vision, double vision and photophobia.  Respiratory: Negative.  Negative for cough and shortness of breath.   Cardiovascular: Negative.  Negative for chest pain, palpitations and leg swelling.  Gastrointestinal: Negative.  Negative for heartburn, nausea and vomiting.  Genitourinary: Negative.   Musculoskeletal: Negative.  Negative for myalgias.  Skin: Negative.   Neurological: Negative.  Negative for dizziness, focal weakness, seizures and headaches.  Endo/Heme/Allergies: Negative.   Psychiatric/Behavioral: Negative.  Negative for suicidal ideas.     Past Medical History:  Diagnosis Date   Hypertension    Murmur, cardiac     Past Surgical History:  Procedure Laterality Date   DENTAL SURGERY      Family History  Problem Relation Age of Onset   Hypertension Mother  Hypertension Father    Hypertension Brother     Social History Reviewed with no changes to be made today.   Outpatient Medications Prior to Visit  Medication Sig Dispense Refill   acetaminophen (TYLENOL) 500 MG tablet Take 500 mg by mouth every 6 (six) hours as needed for headache.      tranexamic acid (LYSTEDA) 650 MG TABS tablet Take 2 tablets (1,300 mg total) by mouth 3 (three) times daily. Take during menses for a maximum of five days 30 tablet 12   atenolol-chlorthalidone (TENORETIC) 50-25 MG tablet Take 1.5 tablets by mouth daily. 135 tablet 1   fluticasone (FLONASE) 50 MCG/ACT nasal spray  Place 1 spray into both nostrils daily. 16 g 0   furosemide (LASIX) 20 MG tablet TAKE 1/2 TABLET BY MOUTH DAILY 15 tablet 0   ferrous gluconate (FERGON) 324 MG tablet Take 1 tablet (324 mg total) by mouth daily with breakfast. (Patient not taking: Reported on 11/25/2023) 30 tablet 3   No facility-administered medications prior to visit.    No Known Allergies     Objective:    BP 129/76 (BP Location: Left Arm, Patient Position: Sitting, Cuff Size: Normal)   Pulse 68   Resp 19   Ht 5\' 2"  (1.575 m)   Wt 178 lb 9.6 oz (81 kg)   LMP 11/18/2023 (Approximate)   SpO2 100%   BMI 32.67 kg/m  Wt Readings from Last 3 Encounters:  11/25/23 178 lb 9.6 oz (81 kg)  04/19/23 165 lb (74.8 kg)  02/08/23 162 lb (73.5 kg)    Physical Exam Vitals and nursing note reviewed.  Constitutional:      Appearance: She is well-developed.  HENT:     Head: Normocephalic and atraumatic.     Right Ear: Hearing, tympanic membrane, ear canal and external ear normal.     Left Ear: Hearing, tympanic membrane, ear canal and external ear normal.     Nose: Nose normal.     Right Turbinates: Not enlarged.     Left Turbinates: Not enlarged.     Mouth/Throat:     Lips: Pink.     Mouth: Mucous membranes are moist.     Dentition: No dental tenderness, gingival swelling, dental abscesses or gum lesions.     Pharynx: No oropharyngeal exudate.  Eyes:     General: No scleral icterus.       Right eye: No discharge.     Extraocular Movements: Extraocular movements intact.     Conjunctiva/sclera: Conjunctivae normal.     Pupils: Pupils are equal, round, and reactive to light.  Neck:     Thyroid: No thyromegaly.     Trachea: No tracheal deviation.  Cardiovascular:     Rate and Rhythm: Normal rate and regular rhythm.     Heart sounds: Normal heart sounds. No murmur heard.    No friction rub. No gallop.  Pulmonary:     Effort: Pulmonary effort is normal. No tachypnea, accessory muscle usage or respiratory distress.      Breath sounds: Normal breath sounds. No decreased breath sounds, wheezing, rhonchi or rales.  Chest:     Chest wall: No tenderness.  Abdominal:     General: Bowel sounds are normal. There is no distension.     Palpations: Abdomen is soft. There is no mass.     Tenderness: There is no abdominal tenderness. There is no right CVA tenderness, left CVA tenderness, guarding or rebound.     Hernia: No hernia is present.  Musculoskeletal:        General: No tenderness or deformity. Normal range of motion.     Cervical back: Normal range of motion and neck supple.  Lymphadenopathy:     Cervical: No cervical adenopathy.  Skin:    General: Skin is warm and dry.     Findings: No erythema.  Neurological:     Mental Status: She is alert and oriented to person, place, and time.     Cranial Nerves: No cranial nerve deficit.     Motor: Motor function is intact.     Coordination: Coordination is intact. Coordination normal.     Gait: Gait is intact.     Deep Tendon Reflexes:     Reflex Scores:      Patellar reflexes are 1+ on the right side and 1+ on the left side. Psychiatric:        Attention and Perception: Attention normal.        Mood and Affect: Mood normal.        Speech: Speech normal.        Behavior: Behavior normal. Behavior is cooperative.        Thought Content: Thought content normal.        Judgment: Judgment normal.          Patient has been counseled extensively about nutrition and exercise as well as the importance of adherence with medications and regular follow-up. The patient was given clear instructions to go to ER or return to medical center if symptoms don't improve, worsen or new problems develop. The patient verbalized understanding.   Follow-up: No follow-ups on file.   Claiborne Rigg, FNP-BC [facility] [place], [place] [phone]   11/25/2023, 3:14 PM

## 2023-11-26 ENCOUNTER — Encounter: Payer: Self-pay | Admitting: Nurse Practitioner

## 2023-11-26 LAB — CMP14+EGFR
ALT: 16 [IU]/L (ref 0–32)
AST: 27 [IU]/L (ref 0–40)
Albumin: 4.2 g/dL (ref 3.9–4.9)
Alkaline Phosphatase: 106 [IU]/L (ref 44–121)
BUN/Creatinine Ratio: 10 (ref 9–23)
BUN: 9 mg/dL (ref 6–24)
Bilirubin Total: <0.2 mg/dL (ref 0.0–1.2)
CO2: 25 mmol/L (ref 20–29)
Calcium: 9.5 mg/dL (ref 8.7–10.2)
Chloride: 103 mmol/L (ref 96–106)
Creatinine, Ser: 0.92 mg/dL (ref 0.57–1.00)
Globulin, Total: 2.9 g/dL (ref 1.5–4.5)
Glucose: 80 mg/dL (ref 70–99)
Potassium: 4.3 mmol/L (ref 3.5–5.2)
Sodium: 141 mmol/L (ref 134–144)
Total Protein: 7.1 g/dL (ref 6.0–8.5)
eGFR: 78 mL/min/{1.73_m2} (ref >59)

## 2023-11-26 LAB — CBC WITH DIFFERENTIAL/PLATELET
Basophils Absolute: 0.1 10*3/uL (ref 0.0–0.2)
Basos: 1 %
EOS (ABSOLUTE): 0.1 10*3/uL (ref 0.0–0.4)
Eos: 2 %
Hematocrit: 38.4 % (ref 34.0–46.6)
Hemoglobin: 11.3 g/dL (ref 11.1–15.9)
Immature Grans (Abs): 0 10*3/uL (ref 0.0–0.1)
Immature Granulocytes: 0 %
Lymphocytes Absolute: 2.4 10*3/uL (ref 0.7–3.1)
Lymphs: 38 %
MCH: 23.1 pg — ABNORMAL LOW (ref 26.6–33.0)
MCHC: 29.4 g/dL — ABNORMAL LOW (ref 31.5–35.7)
MCV: 79 fL (ref 79–97)
Monocytes Absolute: 0.4 10*3/uL (ref 0.1–0.9)
Monocytes: 6 %
Neutrophils Absolute: 3.3 10*3/uL (ref 1.4–7.0)
Neutrophils: 53 %
Platelets: 252 10*3/uL (ref 150–450)
RBC: 4.89 x10E6/uL (ref 3.77–5.28)
RDW: 16.3 % — ABNORMAL HIGH (ref 11.7–15.4)
WBC: 6.3 10*3/uL (ref 3.4–10.8)

## 2023-11-26 LAB — LIPID PANEL
Chol/HDL Ratio: 2.3 {ratio} (ref 0.0–4.4)
Cholesterol, Total: 172 mg/dL (ref 100–199)
HDL: 75 mg/dL (ref >39)
LDL Chol Calc (NIH): 86 mg/dL (ref 0–99)
Triglycerides: 54 mg/dL (ref 0–149)
VLDL Cholesterol Cal: 11 mg/dL (ref 5–40)

## 2023-11-26 LAB — IRON,TIBC AND FERRITIN PANEL
Ferritin: 7 ng/mL — ABNORMAL LOW (ref 15–150)
Iron Saturation: 6 % — CL (ref 15–55)
Iron: 28 ug/dL (ref 27–159)
Total Iron Binding Capacity: 437 ug/dL (ref 250–450)
UIBC: 409 ug/dL (ref 131–425)

## 2023-11-26 LAB — HEMOGLOBIN A1C
Est. average glucose Bld gHb Est-mCnc: 123 mg/dL
Hgb A1c MFr Bld: 5.9 % — ABNORMAL HIGH (ref 4.8–5.6)

## 2024-01-07 ENCOUNTER — Encounter: Payer: Self-pay | Admitting: Internal Medicine

## 2024-01-15 ENCOUNTER — Ambulatory Visit (AMBULATORY_SURGERY_CENTER)

## 2024-01-15 VITALS — Ht 62.0 in | Wt 175.0 lb

## 2024-01-15 DIAGNOSIS — Z1211 Encounter for screening for malignant neoplasm of colon: Secondary | ICD-10-CM

## 2024-01-15 MED ORDER — SUFLAVE 178.7 G PO SOLR: 1.0000 | Freq: Once | ORAL | 0 refills | Status: AC

## 2024-01-15 NOTE — Progress Notes (Signed)

## 2024-02-10 ENCOUNTER — Encounter: Payer: Self-pay | Admitting: Internal Medicine

## 2024-02-12 ENCOUNTER — Ambulatory Visit (AMBULATORY_SURGERY_CENTER): Admitting: Internal Medicine

## 2024-02-12 ENCOUNTER — Other Ambulatory Visit: Payer: Self-pay | Admitting: Internal Medicine

## 2024-02-12 ENCOUNTER — Encounter: Payer: Self-pay | Admitting: Internal Medicine

## 2024-02-12 VITALS — BP 125/72 | HR 60 | Temp 97.4°F | Resp 13 | Ht 62.0 in | Wt 175.0 lb

## 2024-02-12 DIAGNOSIS — K635 Polyp of colon: Secondary | ICD-10-CM | POA: Diagnosis not present

## 2024-02-12 DIAGNOSIS — Z1211 Encounter for screening for malignant neoplasm of colon: Secondary | ICD-10-CM | POA: Diagnosis present

## 2024-02-12 DIAGNOSIS — D123 Benign neoplasm of transverse colon: Secondary | ICD-10-CM

## 2024-02-12 DIAGNOSIS — D122 Benign neoplasm of ascending colon: Secondary | ICD-10-CM

## 2024-02-12 DIAGNOSIS — Z800 Family history of malignant neoplasm of digestive organs: Secondary | ICD-10-CM

## 2024-02-12 DIAGNOSIS — K648 Other hemorrhoids: Secondary | ICD-10-CM | POA: Diagnosis not present

## 2024-02-12 MED ORDER — SODIUM CHLORIDE 0.9 % IV SOLN: 500.0000 mL | Freq: Once | INTRAVENOUS | Status: AC

## 2024-02-12 NOTE — Progress Notes (Signed)
 A/o x 3, VSS, gd SR's, pleased with anesthesia, report to RN

## 2024-02-12 NOTE — Op Note (Addendum)
 [facility] Patient Name: Meagan Padilla Procedure Date: 02/12/2024 12:02 PM MRN: [id number] Endoscopist: Freada Jacobs [place] , , [id number] Age: 48 Referring MD:  Date of Birth: [birth year] Gender: Female Account #: [id number] Procedure:                Colonoscopy Indications:              Screening patient at increased risk: Family history                            of 1st-degree relative with colorectal cancer at                            age 99 years (or older), This is the patient's                            first colonoscopy Medicines:                Monitored Anesthesia Care Procedure:                Pre-Anesthesia Assessment:                           - Prior to the procedure, a History and Physical                            was performed, and patient medications and                            allergies were reviewed. The patient's tolerance of                            previous anesthesia was also reviewed. The risks                            and benefits of the procedure and the sedation                            options and risks were discussed with the patient.                            All questions were answered, and informed consent                            was obtained. Prior Anticoagulants: The patient has                            taken no anticoagulant or antiplatelet agents. ASA                            Grade Assessment: II - A patient with mild systemic                            disease. After reviewing the risks and benefits,  the patient was deemed in satisfactory condition to                            undergo the procedure.                           After obtaining informed consent, the colonoscope                            was passed under direct vision. Throughout the                            procedure, the patient's blood pressure, pulse, and                            oxygen saturations were monitored  continuously. The                            Olympus Scope [id number] was introduced through the                            anus and advanced to the the terminal ileum. The                            colonoscopy was performed without difficulty. The                            patient tolerated the procedure well. The quality                            of the bowel preparation was excellent. The                            terminal ileum, ileocecal valve, appendiceal                            orifice, and rectum were photographed. Scope In: 12:04:38 PM Scope Out: 12:24:36 PM Scope Withdrawal Time: 0 hours 15 minutes 52 seconds  Total Procedure Duration: 0 hours 19 minutes 58 seconds  Findings:                 The terminal ileum appeared normal.                           Two sessile polyps were found in the transverse                            colon and ascending colon. The polyps were 3 to 6                            mm in size. These polyps were removed with a cold                            snare. Resection and retrieval were complete.  Non-bleeding internal hemorrhoids were found during                            retroflexion. Complications:            No immediate complications. Estimated Blood Loss:     Estimated blood loss was minimal. Impression:               - The examined portion of the ileum was normal.                           - Two 3 to 6 mm polyps in the transverse colon and                            in the ascending colon, removed with a cold snare.                            Resected and retrieved.                           - Non-bleeding internal hemorrhoids. Recommendation:           - Discharge patient to home (with escort).                           - Await pathology results.                           - The findings and recommendations were discussed                            with the patient. Dr Pedro Bourgeois "[name]" [name],   02/12/2024 12:30:12 PM

## 2024-02-12 NOTE — Progress Notes (Signed)
 GASTROENTEROLOGY PROCEDURE H&P NOTE   Primary Care Physician: Collins Dean, NP    Reason for Procedure:   Colon cancer screening, family history of colon cancer  Plan:    Colonoscopy  Patient is appropriate for endoscopic procedure(s) in the ambulatory ([facility]) setting.  The nature of the procedure, as well as the risks, benefits, and alternatives were carefully and thoroughly reviewed with the patient. Ample time for discussion and questions allowed. The patient understood, was satisfied, and agreed to proceed.     HPI: Meagan Padilla is a 48 y.o. female who presents for colonoscopy for colon cancer screening, family history of colon cancer. Denies blood in stools, changes in bowel habits, or unintentional weight loss. Father was diagnosed with colon cancer at age 63.   Past Medical History:  Diagnosis Date   Hypertension    Murmur, cardiac     Past Surgical History:  Procedure Laterality Date   COLONOSCOPY     DENTAL SURGERY      Prior to Admission medications   Medication Sig Start Date End Date Taking? Authorizing Provider  acetaminophen  (TYLENOL ) 500 MG tablet Take 500 mg by mouth every 6 (six) hours as needed for headache.     [provider]  atenolol -chlorthalidone  (TENORETIC ) 50-25 MG tablet Take 1.5 tablets by mouth daily. 11/25/23  Yes Fleming, Zelda W, NP  ferrous gluconate  (FERGON) 324 MG tablet Take 1 tablet (324 mg total) by mouth daily with breakfast. 11/25/23  Yes Fleming, Zelda W, NP  furosemide  (LASIX ) 20 MG tablet Take 0.5 tablets (10 mg total) by mouth daily. 11/25/23  Yes Fleming, Zelda W, NP  tranexamic acid  (LYSTEDA ) 650 MG TABS tablet Take 2 tablets (1,300 mg total) by mouth 3 (three) times daily. Take during menses for a maximum of five days 11/15/23   Ajewole, Christana, MD    Current Outpatient Medications  Medication Sig Dispense Refill   acetaminophen  (TYLENOL ) 500 MG tablet Take 500 mg by mouth every 6 (six) hours as needed for  headache.      atenolol -chlorthalidone  (TENORETIC ) 50-25 MG tablet Take 1.5 tablets by mouth daily. 135 tablet 1   ferrous gluconate  (FERGON) 324 MG tablet Take 1 tablet (324 mg total) by mouth daily with breakfast. 90 tablet 3   furosemide  (LASIX ) 20 MG tablet Take 0.5 tablets (10 mg total) by mouth daily. 45 tablet 1   tranexamic acid  (LYSTEDA ) 650 MG TABS tablet Take 2 tablets (1,300 mg total) by mouth 3 (three) times daily. Take during menses for a maximum of five days 30 tablet 12   Current Facility-Administered Medications  Medication Dose Route Frequency Provider Last Rate Last Admin   0.9 %  sodium chloride  infusion  500 mL Intravenous Once Daina Drum, MD        Allergies as of 02/12/2024   (No Known Allergies)    Family History  Problem Relation Age of Onset   Hypertension Mother    Colon cancer Father    Hypertension Father    Hypertension Brother    Rectal cancer Neg Hx    Stomach cancer Neg Hx    Esophageal cancer Neg Hx     Social History   Socioeconomic History   Marital status: Single    Spouse name: Not on file   Number of children: Not on file   Years of education: Not on file   Highest education level: Not on file  Occupational History   Not on file  Tobacco Use  Smoking status: Never   Smokeless tobacco: Never  Vaping Use   Vaping status: Never Used  Substance and Sexual Activity   Alcohol use: No   Drug use: No   Sexual activity: Not Currently  Other Topics Concern   Not on file  Social History Narrative   Not on file   Social Drivers of Health   Financial Resource Strain: Not on file  Food Insecurity: Low Risk  (05/03/2023)   Received from [facility]   Hunger Vital Sign    Worried About Running Out of Food in the Last Year: Never true    Ran Out of Food in the Last Year: Never true  Transportation Needs: Not on file (05/03/2023)  Physical Activity: Not on file  Stress: Not on file  Social Connections: Not on file  Intimate  Partner Violence: Not on file    Physical Exam: Vital signs in last 24 hours: BP (!) 141/85   Pulse 74   Temp (!) 97.4 F (36.3 C) (Temporal)   Ht 5\' 2"  (1.575 m)   Wt 175 lb (79.4 kg)   LMP 02/12/2024   SpO2 100%   BMI 32.01 kg/m  GEN: NAD EYE: Sclerae anicteric ENT: MMM CV: Non-tachycardic Pulm: No increased work of breathing GI: Soft, NT/ND NEURO:  Alert & Oriented   Regino Caprio, MD [facility]  02/12/2024 11:17 AM

## 2024-02-12 NOTE — Progress Notes (Signed)
 Pt's states no medical or surgical changes since previsit or office visit.

## 2024-02-12 NOTE — Patient Instructions (Signed)
 Resume previous diet and medications.  Handout provided on hemorrhoids and polyps.  Repeat colonoscopy based on biopsy results.    YOU HAD AN ENDOSCOPIC PROCEDURE TODAY AT THE [facility]:   Refer to the procedure report that was given to you for any specific questions about what was found during the examination.  If the procedure report does not answer your questions, please call your gastroenterologist to clarify.  If you requested that your care partner not be given the details of your procedure findings, then the procedure report has been included in a sealed envelope for you to review at your convenience later.  YOU SHOULD EXPECT: Some feelings of bloating in the abdomen. Passage of more gas than usual.  Walking can help get rid of the air that was put into your GI tract during the procedure and reduce the bloating. If you had a lower endoscopy (such as a colonoscopy or flexible sigmoidoscopy) you may notice spotting of blood in your stool or on the toilet paper. If you underwent a bowel prep for your procedure, you may not have a normal bowel movement for a few days.  Please Note:  You might notice some irritation and congestion in your nose or some drainage.  This is from the oxygen used during your procedure.  There is no need for concern and it should clear up in a day or so.  SYMPTOMS TO REPORT IMMEDIATELY:  Following lower endoscopy (colonoscopy or flexible sigmoidoscopy):  Excessive amounts of blood in the stool  Significant tenderness or worsening of abdominal pains  Swelling of the abdomen that is new, acute  Fever of 100F or higher  For urgent or emergent issues, a gastroenterologist can be reached at any hour by calling [phone]. Do not use MyChart messaging for urgent concerns.    DIET:  We do recommend a small meal at first, but then you may proceed to your regular diet.  Drink plenty of fluids but you should avoid alcoholic beverages for 24  hours.  ACTIVITY:  You should plan to take it easy for the rest of today and you should NOT DRIVE or use heavy machinery until tomorrow (because of the sedation medicines used during the test).    FOLLOW UP: Our staff will call the number listed on your records the next business day following your procedure.  We will call around 7:15- 8:00 am to check on you and address any questions or concerns that you may have regarding the information given to you following your procedure. If we do not reach you, we will leave a message.     If any biopsies were taken you will be contacted by phone or by letter within the next 1-3 weeks.  Please call us  at [phone] if you have not heard about the biopsies in 3 weeks.    SIGNATURES/CONFIDENTIALITY: You and/or your care partner have signed paperwork which will be entered into your electronic medical record.  These signatures attest to the fact that that the information above on your After Visit Summary has been reviewed and is understood.  Full responsibility of the confidentiality of this discharge information lies with you and/or your care-partner.

## 2024-02-12 NOTE — Progress Notes (Signed)
 Called to room to assist during endoscopic procedure.  Patient ID and intended procedure confirmed with present staff. Received instructions for my participation in the procedure from the performing physician.

## 2024-02-13 ENCOUNTER — Telehealth: Payer: Self-pay

## 2024-02-13 NOTE — Telephone Encounter (Signed)
  Follow up Call-     02/12/2024   11:09 AM  Call back number  Post procedure Call Back phone  # [phone]  Permission to leave phone message Yes     Patient questions:  Do you have a fever, pain , or abdominal swelling? No. Pain Score  0 *  Have you tolerated food without any problems? Yes.    Have you been able to return to your normal activities? Yes.    Do you have any questions about your discharge instructions: Diet   No. Medications  No. Follow up visit  No.  Do you have questions or concerns about your Care? No.  Actions: * If pain score is 4 or above: No action needed, pain <4.

## 2024-02-14 ENCOUNTER — Encounter: Payer: Self-pay | Admitting: Internal Medicine

## 2024-02-14 LAB — SURGICAL PATHOLOGY

## 2024-09-07 ENCOUNTER — Ambulatory Visit (INDEPENDENT_AMBULATORY_CARE_PROVIDER_SITE_OTHER): Payer: Self-pay

## 2024-09-07 ENCOUNTER — Ambulatory Visit: Payer: Self-pay

## 2024-09-07 VITALS — BP 124/78 | HR 108 | Temp 98.8°F | Resp 16 | Ht 62.0 in | Wt 192.0 lb

## 2024-09-07 DIAGNOSIS — S0990XA Unspecified injury of head, initial encounter: Secondary | ICD-10-CM
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
## 2024-09-07 NOTE — Telephone Encounter (Signed)
 Noted

## 2024-09-07 NOTE — Progress Notes (Unsigned)
     Patient ID: Meagan Padilla, female    DOB: 29-Sep-1976  MRN: [id number]  CC: Medical Management of Chronic Issues   Subjective: Meagan Padilla is a 48 y.o. female with past medical history of *** who presents to clinic for 11/6 11:30am  tylenol , lightheaded, no loss of consciousness.   Headache-  Nausea- no  Vomiting-no   Dizziness- no   Poor balance- no   Blurred/double vision- no  Sensitivity to light- no  Sensitivity to noise- no  Ringing in the ears- no  Poor concentration- no  Memory problems- no   Not feeling sharp- no  Fatigue/sluggish- no Sadness/depression-no   Irritability- no   No Known Allergies  ROS: Review of Systems Negative except as stated above  PHYSICAL EXAM: Ht 5' 2 (1.575 m)   Wt 192 lb (87.1 kg)   BMI 35.12 kg/m   Physical Exam  General: well-appearing, no acute distress Skin: no jaundice, rashes, or lesions Cardiovascular: regular heart rate and rhythm, normal S1/S2, no murmurs, gallops, or rubs, peripheral pulses 2+ bilaterally Chest: no skeletal deformity, lungs clear to auscultation bilaterally, equal breath sounds bilaterally Abdomen: soft, non-distended, non-tender to palpation, no hepatomegaly, no splenomegaly, normoactive bowel sounds Musculoskeletal: normal gait Extremities: no peripheral edema  ASSESSMENT AND PLAN:     Patient was given the opportunity to ask questions.  Patient verbalized understanding of the plan and was able to repeat key elements of the plan.    No orders of the defined types were placed in this encounter.    Requested Prescriptions    No prescriptions requested or ordered in this encounter    No follow-ups on file.  Sula Leavy Rode, PA-C

## 2024-09-10 ENCOUNTER — Telehealth (INDEPENDENT_AMBULATORY_CARE_PROVIDER_SITE_OTHER): Payer: Self-pay | Admitting: Nurse Practitioner

## 2024-09-10 ENCOUNTER — Encounter: Payer: Self-pay | Admitting: Nurse Practitioner

## 2024-09-10 ENCOUNTER — Ambulatory Visit: Payer: Self-pay

## 2024-09-10 VITALS — Temp 98.8°F | Ht 62.0 in | Wt 192.0 lb

## 2024-09-10 DIAGNOSIS — J069 Acute upper respiratory infection, unspecified: Secondary | ICD-10-CM | POA: Diagnosis not present

## 2024-09-10 MED ORDER — BENZONATATE 200 MG PO CAPS: 200.0000 mg | ORAL_CAPSULE | Freq: Two times a day (BID) | ORAL | 0 refills | Status: DC | PRN

## 2024-09-10 MED ORDER — AMOXICILLIN 875 MG PO TABS: 875.0000 mg | ORAL_TABLET | Freq: Two times a day (BID) | ORAL | 0 refills | Status: AC

## 2024-09-10 NOTE — Telephone Encounter (Signed)
 Noted

## 2024-09-10 NOTE — Telephone Encounter (Signed)
 FYI Only or Action Required?: FYI only for provider: appointment scheduled on 09/10/24.  Patient was last seen in primary care on 09/07/2024 by Leavy Rode, Sula, PA-C.  Called Nurse Triage reporting Cough.  Symptoms began several days ago.  Interventions attempted: OTC medications: Honey, Cold medication and Rest, hydration, or home remedies.  Symptoms are: gradually worsening.  Triage Disposition: See Physician Within 24 Hours  Patient/caregiver understands and will follow disposition?:    Copied from CRM #[id number]. Topic: Clinical - Red Word Triage >> Sep 10, 2024  8:34 AM Joesph NOVAK wrote: Red Word that prompted transfer to Nurse Triage: Coughing up phlegm.SABRA Seip. Sore throat. Reason for Disposition  [1] Continuous (nonstop) coughing interferes with work or school AND [2] no improvement using cough treatment per Care Advice  Answer Assessment - Initial Assessment Questions 1. ONSET: When did the cough begin?      X 3 days 3. SPUTUM: Describe the color of your sputum (e.g., none, dry cough; clear, white, yellow, green)     Green sputum 4. HEMOPTYSIS: Are you coughing up any blood? If Yes, ask: How much? (e.g., flecks, streaks, tablespoons, etc.)     None 5. DIFFICULTY BREATHING: Are you having difficulty breathing? If Yes, ask: How bad is it? (e.g., mild, moderate, severe)      None 6. FEVER: Do you have a fever? If Yes, ask: What is your temperature, how was it measured, and when did it start?     None 7. CARDIAC HISTORY: Do you have any history of heart disease? (e.g., heart attack, congestive heart failure)      None 8. LUNG HISTORY: Do you have any history of lung disease?  (e.g., pulmonary embolus, asthma, emphysema)     None 9. PE RISK FACTORS: Do you have a history of blood clots? (or: recent major surgery, recent prolonged travel, bedridden)     None 10. OTHER SYMPTOMS: Do you have any other symptoms? (e.g., runny nose, wheezing, chest  pain)       Nasal congestion, sore throat 12. TRAVEL: Have you traveled out of the country in the last month? (e.g., travel history, exposures)       None  Protocols used: Cough - Acute Productive-A-AH

## 2024-09-10 NOTE — Progress Notes (Unsigned)
 Virtual Visit via Video Note  I connected with Meagan Padilla on 09/14/24 at 10:40 AM EST by a video enabled telemedicine application and verified that I am speaking with the correct person using two identifiers.  Location: Patient: home Provider: office   I discussed the limitations of evaluation and management by telemedicine and the availability of in person appointments. The patient expressed understanding and agreed to proceed.  History of Present Illness:  Patient presents today for video visit.  She states that she has been having URI symptoms for the past 2 to 3 days.  She did take a home COVID test which was negative.  She has been having cough and chest congestion with productive green sputum.  She has been having head congestion sinus pressure and pain.  She has been having sore throat and postnasal drip. Denies f/c/s, n/v/d, hemoptysis, PND, leg swelling Denies chest pain or edema     Observations/Objective:     09/10/2024   11:09 AM 09/07/2024    3:38 PM 02/12/2024   12:49 PM  Vitals with BMI  Height 5' 2 5' 2   Weight 192 lbs 192 lbs   BMI 35.11 35.11   Systolic  124 125  Diastolic  78 72  Pulse  108 60     Assessment and Plan:  1. Upper respiratory tract infection, unspecified type (Primary)  - amoxicillin  (AMOXIL ) 875 MG tablet; Take 1 tablet (875 mg total) by mouth 2 (two) times daily for 10 days.  Dispense: 20 tablet; Refill: 0 - benzonatate  (TESSALON ) 200 MG capsule; Take 1 capsule (200 mg total) by mouth 2 (two) times daily as needed for cough.  Dispense: 20 capsule; Refill: 0  Follow up: if symptoms worsen   I discussed the assessment and treatment plan with the patient. The patient was provided an opportunity to ask questions and all were answered. The patient agreed with the plan and demonstrated an understanding of the instructions.   The patient was advised to call back or seek an in-person evaluation if the symptoms worsen or if the condition  fails to improve as anticipated.  I provided 22 minutes of non-face-to-face time during this encounter.   Bascom GORMAN Borer, NP

## 2024-11-04 ENCOUNTER — Telehealth: Payer: Self-pay | Admitting: Nurse Practitioner

## 2024-11-04 NOTE — Telephone Encounter (Signed)
 Contacted pt to confirmed appt (per vr) phone not working

## 2024-11-05 ENCOUNTER — Encounter: Payer: Self-pay | Admitting: *Deleted

## 2024-11-05 ENCOUNTER — Ambulatory Visit: Payer: Self-pay | Attending: *Deleted | Admitting: *Deleted

## 2024-11-05 VITALS — BP 156/87 | HR 69 | Temp 97.7°F | Ht 62.0 in | Wt 192.2 lb

## 2024-11-05 DIAGNOSIS — J069 Acute upper respiratory infection, unspecified: Secondary | ICD-10-CM

## 2024-11-05 MED ORDER — BENZONATATE 100 MG PO CAPS: 100.0000 mg | ORAL_CAPSULE | Freq: Two times a day (BID) | ORAL | 0 refills | Status: AC | PRN

## 2024-11-05 MED ORDER — PREDNISONE 20 MG PO TABS: 40.0000 mg | ORAL_TABLET | Freq: Every day | ORAL | 0 refills | Status: AC

## 2024-11-05 NOTE — Progress Notes (Signed)
 "   Patient ID: Meagan Padilla, female    DOB: [birth year]  MRN: [id number]  CC: Sinusitis   Subjective: Meagan Padilla is a 49 y.o. female who presents for evaluation of upper respiratory symptoms. She reports that she been sick for about 10 days.  Initially had sinus congestion (nasal congestion). She had postnasal drip with cough productive of colored sputum. She feels better than she did but not well.  Still has sinus pressure and nighttime cough.  She uses Flonase  for symptom relief She denies  face pain, tooth pain, jaw pain, fever, chills, wheezing, shortness of breath.. No history of asthma or COPD  Pertinent past medical history: Self-report of sinus infections in the past  Patient Active Problem List   Diagnosis Date Noted   Musculoskeletal pain 04/05/2022     Medications Ordered Prior to Encounter[1]  Allergies[2]  Social History   Socioeconomic History   Marital status: Single    Spouse name: Not on file   Number of children: Not on file   Years of education: Not on file   Highest education level: Not on file  Occupational History   Not on file  Tobacco Use   Smoking status: Never   Smokeless tobacco: Never  Vaping Use   Vaping status: Never Used  Substance and Sexual Activity   Alcohol use: No   Drug use: No   Sexual activity: Not Currently  Other Topics Concern   Not on file  Social History Narrative   Not on file   Social Drivers of Health   Tobacco Use: Low Risk (11/05/2024)   Patient History    Smoking Tobacco Use: Never    Smokeless Tobacco Use: Never    Passive Exposure: Not on file  Financial Resource Strain: Not on file  Food Insecurity: Low Risk (05/03/2023)   Received from [facility]   Epic    Within the past 12 months, you worried that your food would run out before you got money to buy more: Never true    Within the past 12 months, the food you bought just didn't last and you didn't have money to get more. : Never true   Transportation Needs: No Transportation Needs (05/03/2023)   Received from [facility]    In the past 12 months, has lack of reliable transportation kept you from medical appointments, meetings, work or from getting things needed for daily living? : No  Physical Activity: Not on file  Stress: Not on file  Social Connections: Not on file  Intimate Partner Violence: Not on file  Depression (PHQ2-9): Low Risk (09/07/2024)   Depression (PHQ2-9)    PHQ-2 Score: 0  Alcohol Screen: Not on file  Housing: Low Risk (05/03/2023)   Received from [facility]   Epic    What is your living situation today?: I have a steady place to live    Think about the place you live. Do you have problems with any of the following? Choose all that apply:: Not on file  Utilities: Low Risk (05/03/2023)   Received from [facility]   Utilities    In the past 12 months has the electric, gas, oil, or water company threatened to shut off services in your home? : No  Health Literacy: Not on file    Family History  Problem Relation Age of Onset   Hypertension Mother    Colon cancer Father    Hypertension Father    Hypertension Brother  Rectal cancer Neg Hx    Stomach cancer Neg Hx    Esophageal cancer Neg Hx     Past Surgical History:  Procedure Laterality Date   COLONOSCOPY     DENTAL SURGERY      ROS: Review of Systems Negative except as stated above  PHYSICAL EXAM: BP (!) 156/87   Pulse 69   Temp 97.7 F (36.5 C) (Oral)   Ht 5' 2 (1.575 m)   Wt 192 lb 3.2 oz (87.2 kg)   SpO2 100%   BMI 35.15 kg/m   Physical Exam Vitals and nursing note reviewed.  Constitutional:      Appearance: Normal appearance.  HENT:     Head:     Comments: Sinuses nontender to percussion    Nose: Nose normal.     Mouth/Throat:     Mouth: Mucous membranes are moist.     Pharynx: Oropharynx is clear.     Comments: Pharynx with obvious postnasal drip no exudate or abscess.  No  lymphadenopathy. Eyes:     Conjunctiva/sclera: Conjunctivae normal.  Cardiovascular:     Rate and Rhythm: Normal rate and regular rhythm.  Pulmonary:     Effort: Pulmonary effort is normal.     Breath sounds: Normal breath sounds.  Skin:    General: Skin is warm and dry.  Neurological:     Mental Status: She is alert. Mental status is at baseline.          Latest Ref Rng & Units 11/25/2023    3:27 PM 11/02/2022    4:28 PM 06/18/2022    9:11 AM  CMP  Glucose 70 - 99 mg/dL 80  78  89   BUN 6 - 24 mg/dL 9  8  5    Creatinine 0.57 - 1.00 mg/dL 9.07  9.17  9.21   Sodium 134 - 144 mmol/L 141  138  138   Potassium 3.5 - 5.2 mmol/L 4.3  4.4  4.3   Chloride 96 - 106 mmol/L 103  100  105   CO2 20 - 29 mmol/L 25  28  22    Calcium 8.7 - 10.2 mg/dL 9.5  9.8  9.4   Total Protein 6.0 - 8.5 g/dL 7.1  7.2  7.1   Total Bilirubin 0.0 - 1.2 mg/dL <9.7  <9.7  <9.7   Alkaline Phos 44 - 121 IU/L 106  92  72   AST 0 - 40 IU/L 27  22  18    ALT 0 - 32 IU/L 16  11  10     Lipid Panel     Component Value Date/Time   CHOL 172 11/25/2023 1527   TRIG 54 11/25/2023 1527   HDL 75 11/25/2023 1527   CHOLHDL 2.3 11/25/2023 1527   LDLCALC 86 11/25/2023 1527    CBC    Component Value Date/Time   WBC 6.3 11/25/2023 1527   WBC 3.7 (L) 07/16/2019 1250   RBC 4.89 11/25/2023 1527   RBC 5.23 (H) 07/16/2019 1250   HGB 11.3 11/25/2023 1527   HCT 38.4 11/25/2023 1527   PLT 252 11/25/2023 1527   MCV 79 11/25/2023 1527   MCH 23.1 (L) 11/25/2023 1527   MCH 25.0 (L) 07/16/2019 1250   MCHC 29.4 (L) 11/25/2023 1527   MCHC 30.5 07/16/2019 1250   RDW 16.3 (H) 11/25/2023 1527   LYMPHSABS 2.4 11/25/2023 1527   MONOABS 0.4 03/19/2009 1716   EOSABS 0.1 11/25/2023 1527   BASOSABS 0.1 11/25/2023 1527  Results for orders placed or performed in visit on 02/12/24  Surgical pathology   Collection Time: 02/12/24 12:00 AM  Result Value Ref Range   SURGICAL PATHOLOGY      SURGICAL PATHOLOGY [facility] [address], [address] [place], [place] [address] Telephone [phone] or [phone] Fax [phone]  REPORT OF SURGICAL PATHOLOGY   Accession #: [id number] Patient Name: Meagan Padilla Visit # :   MRN: [id number] Physician: Federico Rosario Stagger DOB/Age Aug 01, 1976 (Age: 58) Gender: F Collected Date: 02/12/2024 Received Date: 02/13/2024  FINAL DIAGNOSIS       1. Surgical [P], colon, transverse and ascending, polyp (2) :       -  SESSILE SERRATED POLYP WITHOUT DYSPLASIA, 1 FRAGMENT.      -  TUBULAR ADENOMA, 1 FRAGMENT.       DATE SIGNED OUT: 02/14/2024 ELECTRONIC SIGNATURE : Legolvan Do, Mark, Pathologist, Electronic Signature  MICROSCOPIC DESCRIPTION  CASE COMMENTS STAINS USED IN DIAGNOSIS: H&E H&E-2    CLINICAL HISTORY  SPECIMEN(S) OBTAINED 1. Surgical [P], Colon, Transverse And Ascending, Polyp (2)  SPECIMEN COMMENTS: 1. Special screening for malignant neoplasms, colon; benign  neoplasm of ascending colon; benign neoplasm of transverse colon SPECIMEN CLINICAL INFORMATION: 1. R/O adenoma    Gross Description 1. Received in formalin are tan, soft tissue fragments that are submitted in toto. Number: 2, Size: 0.3 cm smallest to 0.7 cm largest = 3 sec, (1B) ( TA )        Report signed out from the following location(s) [place] [address] [place], [place] [address] CLIA #: [id number]  [facility] [address] [place], [place] [address] CLIA #: [id number]      ASSESSMENT AND PLAN:  Assessment & Plan Viral upper respiratory tract infection Sick for about 10 days with sinus pressure and postnasal drip.  Initially cough productive of colored sputum. No respiratory red flags. She reports history of sinus infections in the past. Uses Flonase  with good relief. Would like something additional for congestion and nighttime cough. Okay for short course of prednisone  to decrease  inflammation in the sinuses and Tessalon  Perles for cough suppression She is encouraged to rest and drink plenty of fluids.   Of note her blood pressure is mildly elevated today probably because of her acute illness. Continue to follow  notify us  if her symptoms persist or worsen Orders:   predniSONE  (DELTASONE ) 20 MG tablet; Take 2 tablets (40 mg total) by mouth daily with breakfast.   benzonatate  (TESSALON ) 100 MG capsule; Take 1 capsule (100 mg total) by mouth 2 (two) times daily as needed for cough.       Patient was given the opportunity to ask questions.  Patient verbalized understanding of the plan and was able to repeat key elements of the plan.   This documentation was completed using Paediatric nurse.  Any transcriptional errors are unintentional.     Requested Prescriptions   Signed Prescriptions Disp Refills   predniSONE  (DELTASONE ) 20 MG tablet 10 tablet 0    Sig: Take 2 tablets (40 mg total) by mouth daily with breakfast.   benzonatate  (TESSALON ) 100 MG capsule 20 capsule 0    Sig: Take 1 capsule (100 mg total) by mouth 2 (two) times daily as needed for cough.    No follow-ups on file.  Draeden Kellman H, NP     [1]  Current Outpatient Medications on File Prior to Visit  Medication Sig  Dispense Refill   acetaminophen  (TYLENOL ) 500 MG tablet Take 500 mg by mouth every 6 (six) hours as needed for headache.      atenolol -chlorthalidone  (TENORETIC ) 50-25 MG tablet Take 1.5 tablets by mouth daily. 135 tablet 1   ferrous gluconate  (FERGON) 324 MG tablet Take 1 tablet (324 mg total) by mouth daily with breakfast. 90 tablet 3   furosemide  (LASIX ) 20 MG tablet Take 0.5 tablets (10 mg total) by mouth daily. 45 tablet 1   tranexamic acid  (LYSTEDA ) 650 MG TABS tablet Take 2 tablets (1,300 mg total) by mouth 3 (three) times daily. Take during menses for a maximum of five days 30 tablet 12   Current Facility-Administered Medications on File Prior to Visit   Medication Dose Route Frequency Provider Last Rate Last Admin   0.9 %  sodium chloride  infusion  500 mL Intravenous Once Dorsey, Ying C, MD      [2] No Known Allergies  "

## 2024-11-05 NOTE — Patient Instructions (Signed)
 Today we talked about your upper respiratory symptoms including congestion in the ethmoid sinuses. Sounds like you have gotten over the worst of it but still have some sinus pain and postnasal drip that causes a cough. We try to stay away from antibiotics as possible so we will go with a round of prednisone  that should get rid of inflammation in the sinuses and some Tessalon  Perles for cough at night.  These will be called into the pharmacy of your choice.  Certainly let us  know if your symptoms persist or worsen

## 2024-11-21 ENCOUNTER — Other Ambulatory Visit: Payer: Self-pay | Admitting: Nurse Practitioner

## 2024-11-21 DIAGNOSIS — R600 Localized edema: Secondary | ICD-10-CM
